# Patient Record
Sex: Male | Born: 1975 | Race: Black or African American | Hispanic: No | Marital: Single | Smoking: Never smoker
Health system: Southern US, Community
[De-identification: ages and names within clinical notes are randomized; demographics above are authoritative.]

## PROBLEM LIST (undated history)

## (undated) DIAGNOSIS — I10 Essential (primary) hypertension: Secondary | ICD-10-CM

## (undated) DIAGNOSIS — F32A Depression, unspecified: Secondary | ICD-10-CM

## (undated) DIAGNOSIS — R7303 Prediabetes: Secondary | ICD-10-CM

## (undated) DIAGNOSIS — R519 Headache, unspecified: Secondary | ICD-10-CM

## (undated) HISTORY — DX: Headache, unspecified: R51.9

## (undated) HISTORY — DX: Depression, unspecified: F32.A

---

## 2015-01-02 ENCOUNTER — Encounter: Payer: Self-pay | Admitting: Emergency Medicine

## 2015-01-02 ENCOUNTER — Emergency Department: Payer: 59

## 2015-01-02 ENCOUNTER — Emergency Department
Admission: EM | Admit: 2015-01-02 | Discharge: 2015-01-02 | Disposition: A | Discharge status: Home / Self Care | Payer: 59 | Attending: Emergency Medicine | Admitting: Emergency Medicine

## 2015-01-02 DIAGNOSIS — I10 Essential (primary) hypertension: Secondary | ICD-10-CM | POA: Insufficient documentation

## 2015-01-02 DIAGNOSIS — R079 Chest pain, unspecified: Secondary | ICD-10-CM | POA: Diagnosis not present

## 2015-01-02 DIAGNOSIS — Z87891 Personal history of nicotine dependence: Secondary | ICD-10-CM | POA: Diagnosis not present

## 2015-01-02 DIAGNOSIS — R55 Syncope and collapse: Secondary | ICD-10-CM | POA: Insufficient documentation

## 2015-01-02 DIAGNOSIS — Z87898 Personal history of other specified conditions: Secondary | ICD-10-CM

## 2015-01-02 HISTORY — DX: Prediabetes: R73.03

## 2015-01-02 HISTORY — DX: Essential (primary) hypertension: I10

## 2015-01-02 LAB — CBC
HEMATOCRIT: 45 % (ref 40.0–52.0)
Hemoglobin: 14.9 g/dL (ref 13.0–18.0)
MCH: 28.7 pg (ref 26.0–34.0)
MCHC: 33.2 g/dL (ref 32.0–36.0)
MCV: 86.6 fL (ref 80.0–100.0)
Platelets: 100 10*3/uL — ABNORMAL LOW (ref 150–440)
RBC: 5.2 MIL/uL (ref 4.40–5.90)
RDW: 13.8 % (ref 11.5–14.5)
WBC: 8.1 10*3/uL (ref 3.8–10.6)

## 2015-01-02 LAB — BASIC METABOLIC PANEL
ANION GAP: 10 (ref 5–15)
BUN: 10 mg/dL (ref 6–20)
CALCIUM: 9.2 mg/dL (ref 8.9–10.3)
CO2: 23 mmol/L (ref 22–32)
Chloride: 109 mmol/L (ref 101–111)
Creatinine, Ser: 1.24 mg/dL (ref 0.61–1.24)
GFR calc Af Amer: >60 mL/min (ref >60)
Glucose, Bld: 109 mg/dL — ABNORMAL HIGH (ref 65–99)
POTASSIUM: 4 mmol/L (ref 3.5–5.1)
SODIUM: 142 mmol/L (ref 135–145)

## 2015-01-02 LAB — FIBRIN DERIVATIVES D-DIMER (ARMC ONLY): FIBRIN DERIVATIVES D-DIMER (ARMC): 224 (ref 0–499)

## 2015-01-02 LAB — TROPONIN I

## 2015-01-02 MED ORDER — KETOROLAC TROMETHAMINE 30 MG/ML IJ SOLN
30.0000 mg | Freq: Once | INTRAMUSCULAR | Status: AC
  Administered 2015-01-02: 30 mg via INTRAVENOUS
  Filled 2015-01-02: qty 1

## 2015-01-02 NOTE — ED Provider Notes (Signed)
Time Seen: Approximately 1600  I have reviewed the triage notes  Chief Complaint: Chest Pain and Loss of Consciousness   History of Present Illness: Austin BonierRobert Mcconnell is a 39 y.o. male who presents with what he describes as a one-year history of intermittent sharp chest pain before he states occasionally he gets a pressure discomfort that lasts for less than a minute. He states his last cardiac [place] sounds to [place]. He has a history of hypertension and "" borderline "" diabetes. No family history of early cardiovascular disease. He states he was standing and having a verbal disagreement with his girlfriend when he had sudden onset of sharp chest pain and then had a brief syncopal episode without any associated seizure activity. He denies any chest pain at present. He denies any current nausea or vomiting but states when he woke up he was somewhat nauseated. He denies any arm job back or flank discomfort. He states that the chest discomfort that he said intermittently for a year.can come on randomly and denies any obvious exertional factor. Patient denies any recent pulmonary emboli risk factors.  Past Medical History  Diagnosis Date  . Hypertension   . Prediabetes     There are no active problems to display for this patient.   History reviewed. No pertinent past surgical history.  History reviewed. No pertinent past surgical history.  No current outpatient prescriptions on file.  Allergies:  Review of patient's allergies indicates no known allergies.  Family History: History reviewed. No pertinent family history.  Social History: Social History  Substance Use Topics  . Smoking status: Former Games developermoker  . Smokeless tobacco: None  . Alcohol Use: Yes     Comment: occasional beer     Review of Systems:   10 point review of systems was performed and was otherwise negative:  Constitutional: No fever Eyes: No visual disturbances ENT: No sore  throat, ear pain Cardiac: No chest pain Respiratory: No shortness of breath, wheezing, or stridor Abdomen: No abdominal pain, no vomiting, No diarrhea Endocrine: No weight loss, No night sweats Extremities: No peripheral edema, cyanosis Skin: No rashes, easy bruising Neurologic: Patient describes a bilateral temporal headache. He states he's had similar headaches like this before and normally takes a "" BC powder "" and it usually resolves. No focal weakness, trouble with speech or swollowing Urologic: No dysuria, Hematuria, or urinary frequency   Physical Exam:  ED Triage Vitals  Enc Vitals Group     BP 01/02/15 1534 129/92 mmHg     Pulse Rate 01/02/15 1534 102     Resp 01/02/15 1534 13     Temp 01/02/15 1534 98.8 F (37.1 C)     Temp Source 01/02/15 1534 Oral     SpO2 01/02/15 1534 95 %     Weight 01/02/15 1534 200 lb (90.719 kg)     Height 01/02/15 1534 5\' 5"  (1.651 m)     Head Cir --      Peak Flow --      Pain Score --      Pain Loc --      Pain Edu? --      Excl. in GC? --     General: Awake , Alert , and Oriented times 3; GCS 15 Head: Normal cephalic , atraumatic Eyes: Pupils equal , round, reactive to light Nose/Throat: No nasal drainage, patent upper airway without erythema or exudate.  Neck: Supple, Full range of motion, No anterior adenopathy  or palpable thyroid masses Lungs: Clear to ascultation without wheezes , rhonchi, or rales Heart: Regular rate, regular rhythm without murmurs , gallops , or rubs Abdomen: Soft, non tender without rebound, guarding , or rigidity; bowel sounds positive and symmetric in all 4 quadrants. No organomegaly .        Extremities: 2 plus symmetric pulses. No edema, clubbing or cyanosis Neurologic: normal ambulation, Motor symmetric without deficits, sensory intact Skin: warm, dry, no rashes   Labs:   All laboratory work was reviewed including any pertinent negatives or positives listed below:  Labs Reviewed  BASIC METABOLIC  PANEL - Abnormal; Notable for the following:    Glucose, Bld 109 (*)    All other components within normal limits  CBC - Abnormal; Notable for the following:    Platelets 100 (*)    All other components within normal limits  TROPONIN I  FIBRIN DERIVATIVES D-DIMER ([facility] ONLY)   laboratory work was reviewed with no significant abnormalities  EKG:  ED ECG REPORT I, Jennye Moccasin, the attending physician, personally viewed and interpreted this ECG.  Date: 01/02/2015 EKG Time: 1530 Rate: 101 Rhythm: normal sinus rhythm QRS Axis: normal Intervals: normal ST/T Wave abnormalities: normal Conduction Disutrbances: none Narrative Interpretation: unremarkable Findings of early repolarization   Radiology:    EXAM: CHEST 2 VIEW  COMPARISON: None.  FINDINGS: The heart size and mediastinal contours are within normal limits. Both lungs are clear. The visualized skeletal structures are unremarkable.  IMPRESSION: No active cardiopulmonary disease.   I personally reviewed the radiologic studies    ED Course: * Patient's stay here was uneventful and given that these episodes of transient chest discomfort have been occurring now for the past year I felt we could evaluate the patient on an outpatient basis. The syncopal episode today was a new finding and I don't suspect any life-threatening arrhythmias at this time. He did not injure himself with his fall and it seems it was more of a gradual loss of consciousness which would make it unlikely to be cardiogenic in nature. Patient does not appear to have any cardiomegaly or any abnormalities on his EKG that would indicate IHSS. I discussed the patient at length the necessity and follow-up with cardiology for possible echocardiogram etc. Patient's case was reviewed with the unassigned cardiologist who agrees with outpatient management and the patient's been advised to call their office in the morning.   Assessment: Nonspecific chest  pain Syncope     Plan: * Outpatient management Patient was advised to return immediately if condition worsens. Patient was advised to follow up with their primary care physician or other specialized physicians involved in their outpatient care             Jennye Moccasin, MD 01/02/15 [phone]

## 2015-01-02 NOTE — ED Notes (Signed)
Pt to ED from home via EMS c/o chest pain and syncope.  Per EMS patient arguing with girlfriend and had sudden sharp pain to left chest and then patient had syncopal episode.  Pt remembers waking up on ground lying on back.  Pt denies hitting head.  Pt has hx of similar chest pain with today being worse, denies strenuous activity.  Pt hx of HTN, prediabetes, denies family hx.  Pt states feeling lightheaded, dizzy, and flushed.  Presents A&Ox4, speaking in complete and coherent sentences and in NAD at this time.

## 2015-01-02 NOTE — ED Notes (Signed)
Patient transported to X-ray 

## 2015-09-22 DIAGNOSIS — G4733 Obstructive sleep apnea (adult) (pediatric): Secondary | ICD-10-CM | POA: Insufficient documentation

## 2015-10-28 ENCOUNTER — Ambulatory Visit
Admission: RE | Admit: 2015-10-28 | Discharge: 2015-10-28 | Disposition: A | Discharge status: Home / Self Care | Payer: Commercial Managed Care - HMO | Source: Ambulatory Visit | Attending: Internal Medicine | Admitting: Internal Medicine

## 2015-10-28 ENCOUNTER — Encounter
Admission: RE | Disposition: A | Discharge status: Home / Self Care | Payer: Self-pay | Source: Ambulatory Visit | Attending: Internal Medicine

## 2015-10-28 ENCOUNTER — Encounter: Payer: Self-pay | Admitting: Internal Medicine

## 2015-10-28 DIAGNOSIS — G4733 Obstructive sleep apnea (adult) (pediatric): Secondary | ICD-10-CM | POA: Insufficient documentation

## 2015-10-28 DIAGNOSIS — R0789 Other chest pain: Secondary | ICD-10-CM | POA: Diagnosis not present

## 2015-10-28 DIAGNOSIS — Z79899 Other long term (current) drug therapy: Secondary | ICD-10-CM | POA: Diagnosis not present

## 2015-10-28 DIAGNOSIS — I10 Essential (primary) hypertension: Secondary | ICD-10-CM | POA: Insufficient documentation

## 2015-10-28 DIAGNOSIS — Z87891 Personal history of nicotine dependence: Secondary | ICD-10-CM | POA: Insufficient documentation

## 2015-10-28 DIAGNOSIS — Z6833 Body mass index (BMI) 33.0-33.9, adult: Secondary | ICD-10-CM | POA: Insufficient documentation

## 2015-10-28 DIAGNOSIS — E669 Obesity, unspecified: Secondary | ICD-10-CM | POA: Diagnosis not present

## 2015-10-28 DIAGNOSIS — E119 Type 2 diabetes mellitus without complications: Secondary | ICD-10-CM | POA: Insufficient documentation

## 2015-10-28 DIAGNOSIS — R079 Chest pain, unspecified: Secondary | ICD-10-CM | POA: Diagnosis present

## 2015-10-28 HISTORY — PX: CARDIAC CATHETERIZATION: SHX172

## 2015-10-28 LAB — CARDIAC CATHETERIZATION: CATHEFQUANT: 60 %

## 2015-10-28 SURGERY — LEFT HEART CATH AND CORONARY ANGIOGRAPHY
Anesthesia: Moderate Sedation | Laterality: Left

## 2015-10-28 SURGERY — LEFT HEART CATH AND CORONARY ANGIOGRAPHY
Anesthesia: Moderate Sedation

## 2015-10-28 MED ORDER — ACETAMINOPHEN 325 MG PO TABS
650.0000 mg | ORAL_TABLET | ORAL | Status: DC | PRN
  Administered 2015-10-28: 650 mg via ORAL

## 2015-10-28 MED ORDER — SODIUM CHLORIDE 0.9 % IV SOLN: 250.0000 mL | INTRAVENOUS | Status: DC | PRN

## 2015-10-28 MED ORDER — ACETAMINOPHEN 325 MG PO TABS
ORAL_TABLET | ORAL | Status: AC
  Administered 2015-10-28: 650 mg via ORAL
  Filled 2015-10-28: qty 2

## 2015-10-28 MED ORDER — SODIUM CHLORIDE 0.9% FLUSH: 3.0000 mL | Freq: Two times a day (BID) | INTRAVENOUS | Status: DC

## 2015-10-28 MED ORDER — SODIUM CHLORIDE 0.9 % IV SOLN: INTRAVENOUS | Status: DC

## 2015-10-28 MED ORDER — FENTANYL CITRATE (PF) 100 MCG/2ML IJ SOLN
INTRAMUSCULAR | Status: AC
  Filled 2015-10-28: qty 2

## 2015-10-28 MED ORDER — MIDAZOLAM HCL 2 MG/2ML IJ SOLN
INTRAMUSCULAR | Status: AC
  Filled 2015-10-28: qty 2

## 2015-10-28 MED ORDER — FENTANYL CITRATE (PF) 100 MCG/2ML IJ SOLN
INTRAMUSCULAR | Status: DC | PRN
  Administered 2015-10-28: 25 ug via INTRAVENOUS

## 2015-10-28 MED ORDER — ASPIRIN 81 MG PO CHEW: 81.0000 mg | CHEWABLE_TABLET | ORAL | Status: DC

## 2015-10-28 MED ORDER — SODIUM CHLORIDE 0.9% FLUSH: 3.0000 mL | INTRAVENOUS | Status: DC | PRN

## 2015-10-28 MED ORDER — MIDAZOLAM HCL 2 MG/2ML IJ SOLN
INTRAMUSCULAR | Status: DC | PRN
  Administered 2015-10-28: 1 mg via INTRAVENOUS

## 2015-10-28 MED ORDER — HEPARIN (PORCINE) IN NACL 2-0.9 UNIT/ML-% IJ SOLN
INTRAMUSCULAR | Status: AC
  Filled 2015-10-28: qty 1000

## 2015-10-28 MED ORDER — ONDANSETRON HCL 4 MG/2ML IJ SOLN: 4.0000 mg | Freq: Four times a day (QID) | INTRAMUSCULAR | Status: DC | PRN

## 2015-10-28 MED ORDER — SODIUM CHLORIDE 0.9 % WEIGHT BASED INFUSION: 3.0000 mL/kg/h | INTRAVENOUS | Status: DC

## 2015-10-28 MED ORDER — IOPAMIDOL (ISOVUE-300) INJECTION 61%
INTRAVENOUS | Status: DC | PRN
  Administered 2015-10-28: 100 mL via INTRA_ARTERIAL

## 2015-10-28 SURGICAL SUPPLY — 9 items
CATH 5FR JL4 DIAGNOSTIC (CATHETERS) ×2 IMPLANT
CATH 5FR JR4 DIAGNOSTIC (CATHETERS) ×2 IMPLANT
CATH 5FR PIGTAIL DIAGNOSTIC (CATHETERS) ×2 IMPLANT
DEVICE CLOSURE MYNXGRIP 5F (Vascular Products) ×2 IMPLANT
KIT MANI 3VAL PERCEP (MISCELLANEOUS) ×2 IMPLANT
NEEDLE PERC 18GX7CM (NEEDLE) ×2 IMPLANT
PACK CARDIAC CATH (CUSTOM PROCEDURE TRAY) ×2 IMPLANT
SHEATH AVANTI 5FR X 11CM (SHEATH) ×2 IMPLANT
WIRE EMERALD 3MM-J .035X150CM (WIRE) ×2 IMPLANT

## 2015-10-28 NOTE — Discharge Instructions (Signed)

## 2017-05-11 IMAGING — CR DG CHEST 2V
1 series · 2 of 2 positions shown · non-contrast
Comparison: None.

CLINICAL DATA: Anterior chest pain, syncope

EXAM:
CHEST  2 VIEW

[Series 1: dg chest 2 view · 0.14mm/px · 2 of 2 slices shown]
[im 1/2]
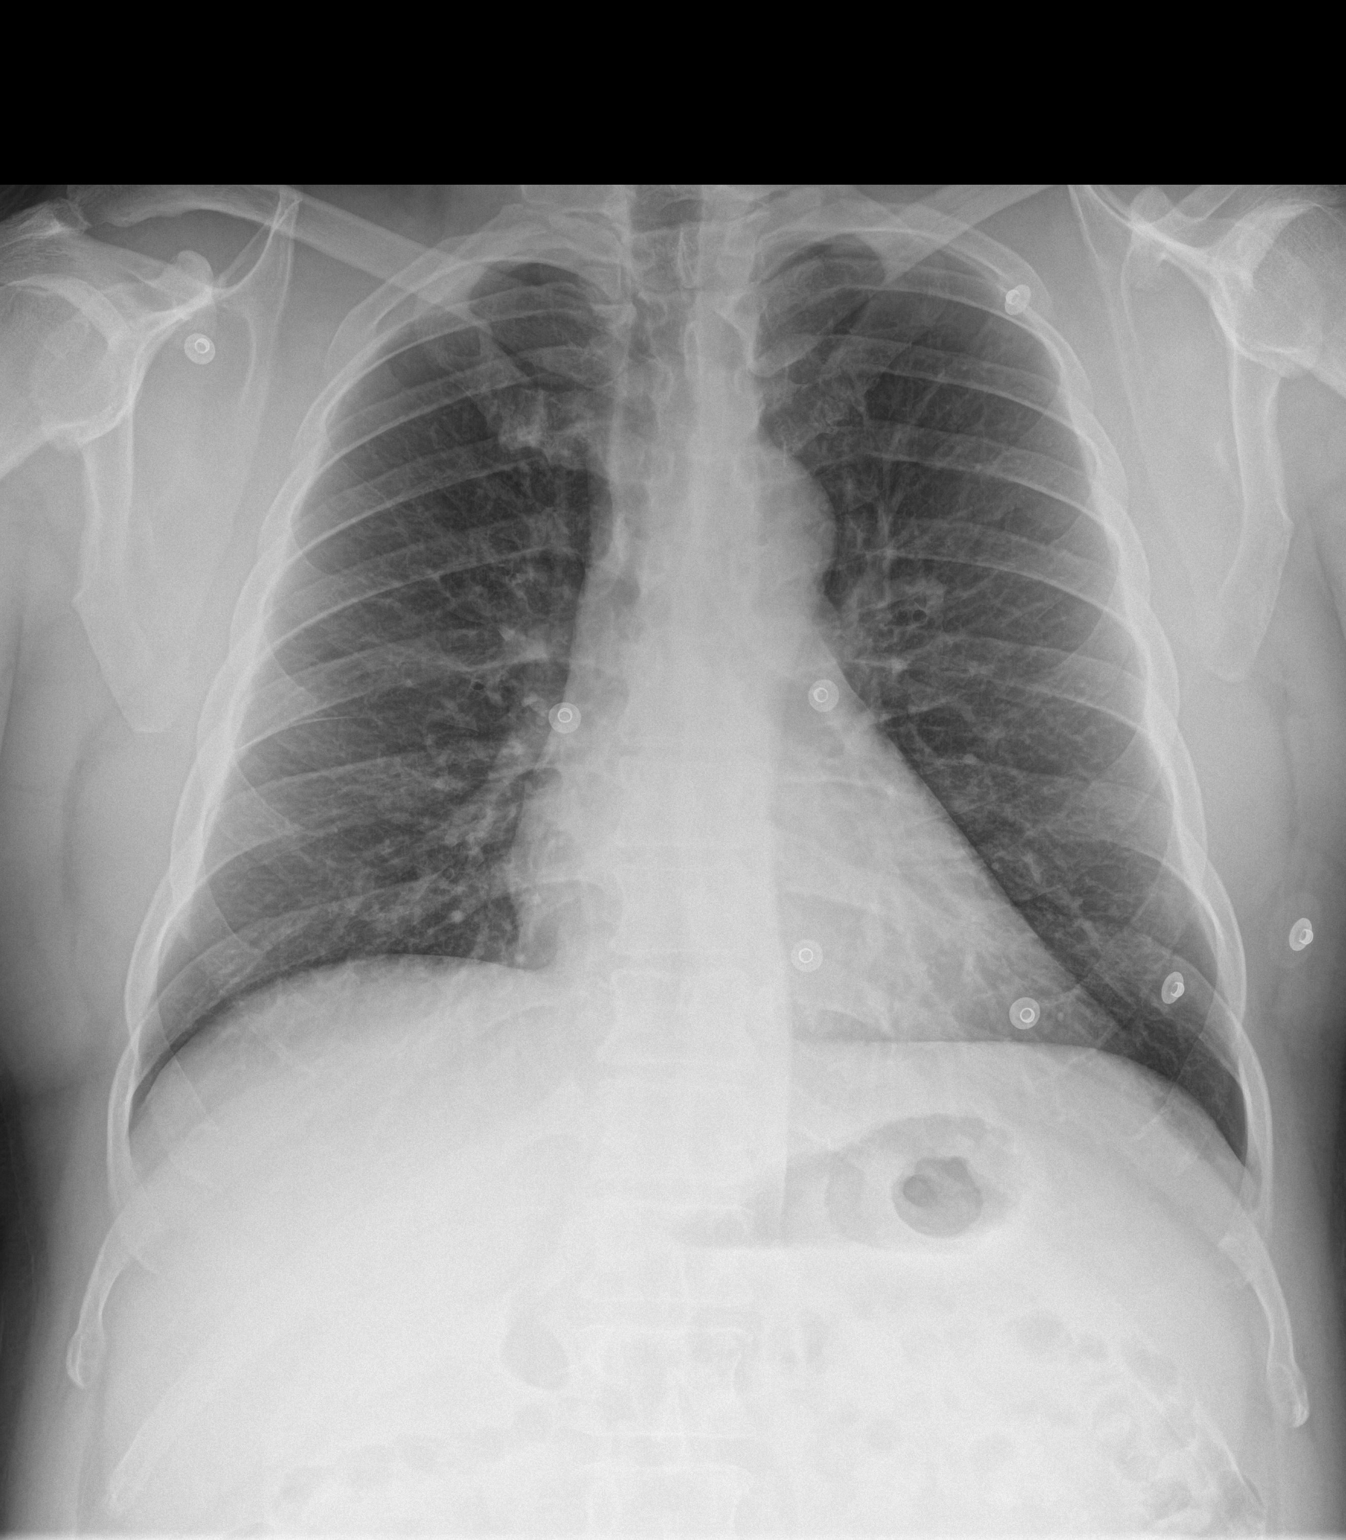
[im 2/2]
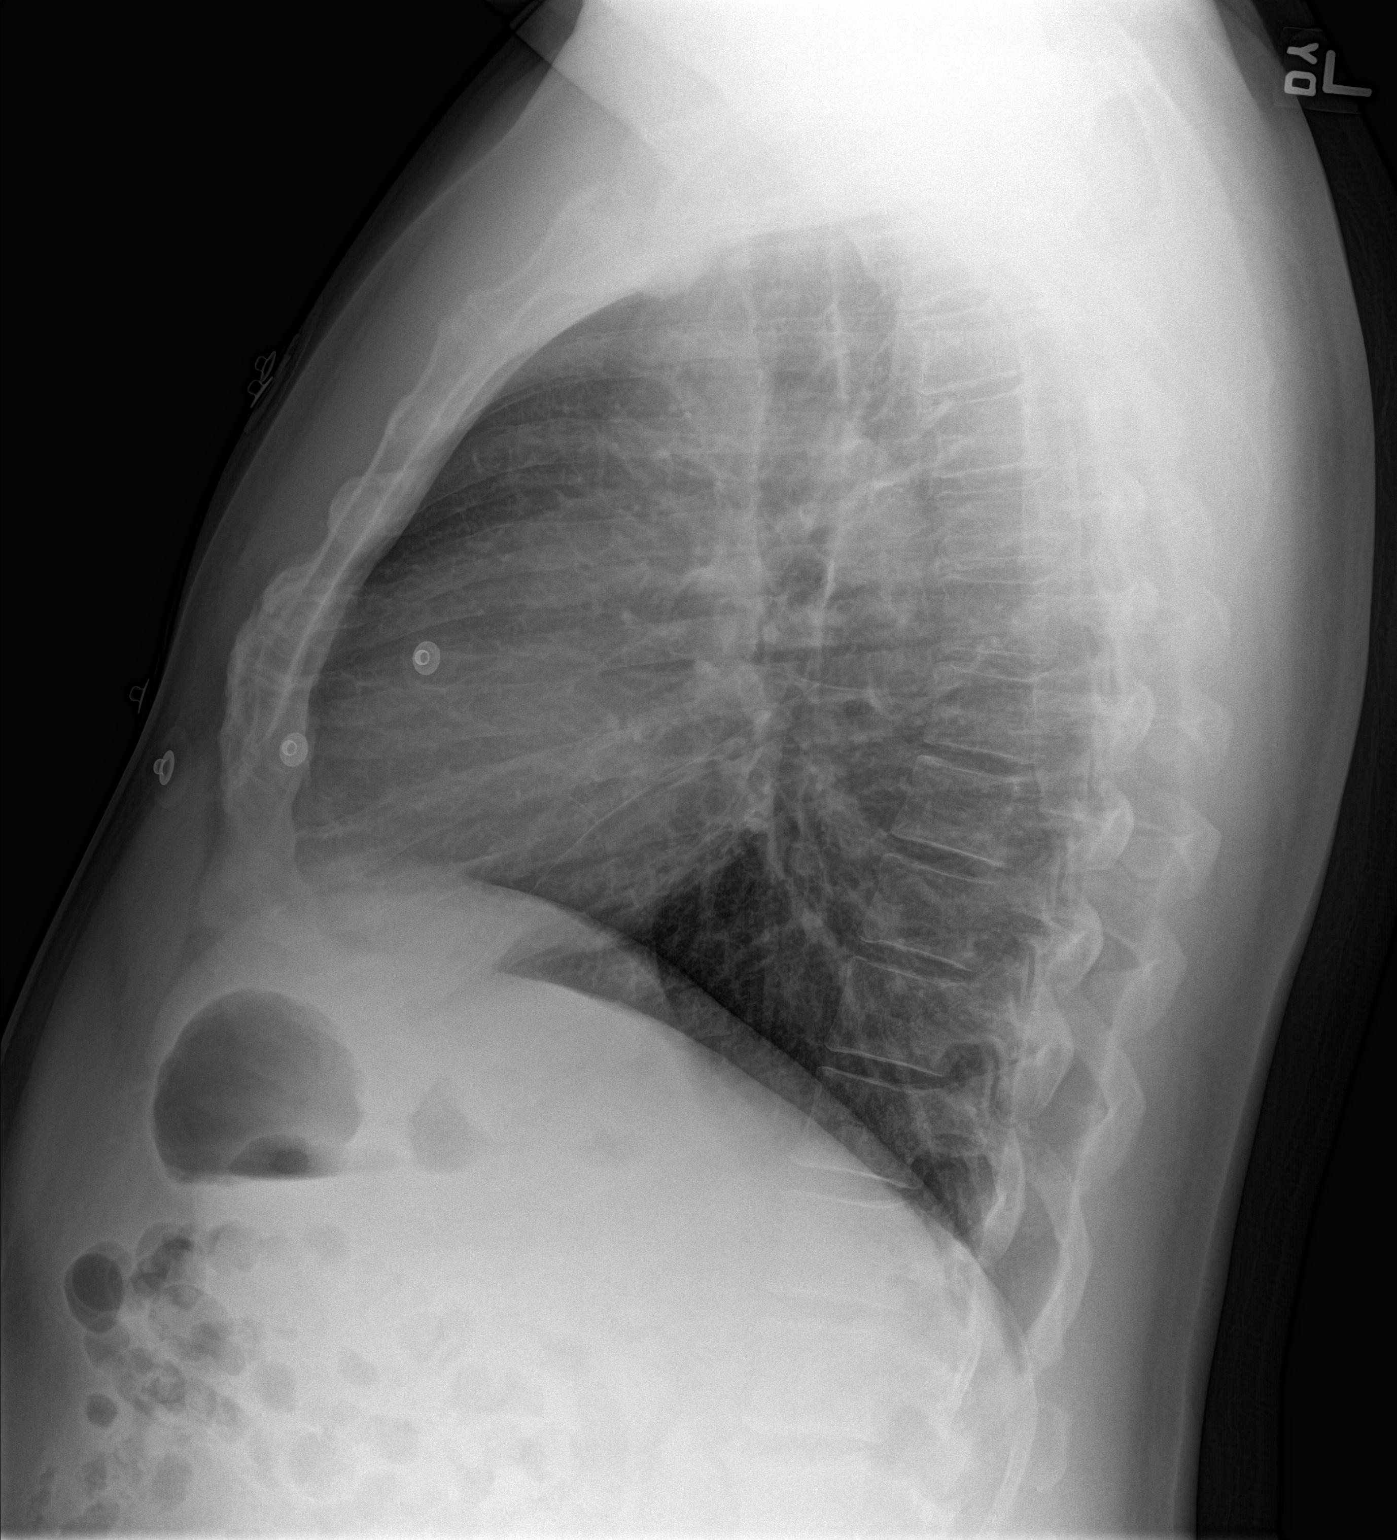

[2 of 2 positions shown; findings below may reference images not displayed]

FINDINGS: The heart size and mediastinal contours are within normal limits.
Both lungs are clear. The visualized skeletal structures are
unremarkable.
IMPRESSION: No active cardiopulmonary disease.

## 2020-08-19 ENCOUNTER — Other Ambulatory Visit: Payer: Self-pay

## 2020-08-19 ENCOUNTER — Encounter: Payer: Self-pay | Admitting: Physician Assistant

## 2020-08-19 ENCOUNTER — Ambulatory Visit: Payer: Self-pay | Admitting: Physician Assistant

## 2020-08-19 DIAGNOSIS — Z113 Encounter for screening for infections with a predominantly sexual mode of transmission: Secondary | ICD-10-CM

## 2020-08-19 DIAGNOSIS — Z202 Contact with and (suspected) exposure to infections with a predominantly sexual mode of transmission: Secondary | ICD-10-CM

## 2020-08-19 LAB — GRAM STAIN

## 2020-08-19 MED ORDER — METRONIDAZOLE 500 MG PO TABS: 500.0000 mg | ORAL_TABLET | Freq: Two times a day (BID) | ORAL | 0 refills | Status: AC

## 2020-08-19 NOTE — Progress Notes (Signed)
[facility] Department STI clinic/screening visit  Subjective:  Austin Mcconnell is a 45 y.o. male being seen today for an STI screening visit. The patient reports they do have symptoms.    Patient has the following medical conditions:  There are no problems to display for this patient.    Chief Complaint  Patient presents with   SEXUALLY TRANSMITTED DISEASE    screening    HPI  Patient reports that he is a contact to Trich and has had a "pinching/feels like a needle is inside my urethra" off and on for 1 week.  Denies chronic conditions and regular medicines.  States it has been several years since his last HIV test and last void prior to sample collection for Gram stain was about 45 min ago.   See flowsheet for further details and programmatic requirements.    The following portions of the patient's history were reviewed and updated as appropriate: allergies, current medications, past medical history, past social history, past surgical history and problem list.  Objective:  There were no vitals filed for this visit.  Physical Exam Constitutional:      General: He is not in acute distress.    Appearance: Normal appearance.  HENT:     Head: Normocephalic and atraumatic.     Comments: No nits,lice, or hair loss. No cervical, supraclavicular or axillary adenopathy.     Mouth/Throat:     Mouth: Mucous membranes are moist.     Pharynx: Oropharynx is clear. No oropharyngeal exudate or posterior oropharyngeal erythema.  Eyes:     Conjunctiva/sclera: Conjunctivae normal.  Pulmonary:     Effort: Pulmonary effort is normal.  Abdominal:     Palpations: Abdomen is soft. There is no mass.     Tenderness: There is no abdominal tenderness. There is no guarding or rebound.  Genitourinary:    Penis: Normal.      Testes: Normal.     Comments: Pubic area without nits, lice, hair loss, edema, erythema, lesions and inguinal adenopathy. Penis circumcised without rash, lesions  and discharge at meatus. Testicles descended bilaterally,nt, no masses or edema.  Musculoskeletal:     Cervical back: Neck supple. No tenderness.  Skin:    General: Skin is warm and dry.     Findings: No bruising, erythema, lesion or rash.  Neurological:     Mental Status: He is alert and oriented to person, place, and time.  Psychiatric:        Mood and Affect: Mood normal.        Behavior: Behavior normal.        Thought Content: Thought content normal.        Judgment: Judgment normal.      Assessment and Plan:  Austin Mcconnell is a 45 y.o. male presenting to the [facility] Department for STI screening  1. Screening for STD (sexually transmitted disease) Patient into clinic without symptoms. Rec condoms with all sex. Await test results.  Counseled that RN will call if needs to RTC for treatment once results are back.  - Gram stain - Gonococcus culture - HIV [facility] - Syphilis Serology, [facility] - Gonococcus culture  2. Trichomonas contact Treat as a contact to Trich with Metronidazole 500 mg #14 1 po BID for 7 days with food, no EtOH for 24 hr before and until 72 hr after completing medicine. No sex for 14 days and until after partner completes treatment. Call with questions or concerns. - metroNIDAZOLE (  FLAGYL) 500 MG tablet; Take 1 tablet (500 mg total) by mouth 2 (two) times daily for 7 days.  Dispense: 14 tablet; Refill: 0     No follow-ups on file.  No future appointments.  Matt Holmes, PA

## 2020-08-19 NOTE — Progress Notes (Signed)
Lab results reviewed with provider. Providers orders completed.

## 2020-08-23 LAB — GONOCOCCUS CULTURE

## 2020-10-13 ENCOUNTER — Encounter: Payer: Self-pay | Admitting: Emergency Medicine

## 2020-10-13 ENCOUNTER — Other Ambulatory Visit: Payer: Self-pay

## 2020-10-13 ENCOUNTER — Emergency Department
Admission: EM | Admit: 2020-10-13 | Discharge: 2020-10-13 | Disposition: A | Discharge status: Home / Self Care | Payer: Self-pay | Attending: Emergency Medicine | Admitting: Emergency Medicine

## 2020-10-13 DIAGNOSIS — F17210 Nicotine dependence, cigarettes, uncomplicated: Secondary | ICD-10-CM | POA: Insufficient documentation

## 2020-10-13 DIAGNOSIS — R300 Dysuria: Secondary | ICD-10-CM | POA: Insufficient documentation

## 2020-10-13 DIAGNOSIS — Z79899 Other long term (current) drug therapy: Secondary | ICD-10-CM | POA: Insufficient documentation

## 2020-10-13 DIAGNOSIS — I10 Essential (primary) hypertension: Secondary | ICD-10-CM | POA: Insufficient documentation

## 2020-10-13 DIAGNOSIS — R369 Urethral discharge, unspecified: Secondary | ICD-10-CM | POA: Insufficient documentation

## 2020-10-13 LAB — URINALYSIS, COMPLETE (UACMP) WITH MICROSCOPIC
Bacteria, UA: NONE SEEN
Bilirubin Urine: NEGATIVE
Glucose, UA: NEGATIVE mg/dL
Hgb urine dipstick: NEGATIVE
Ketones, ur: NEGATIVE mg/dL
Leukocytes,Ua: NEGATIVE
Nitrite: NEGATIVE
Protein, ur: NEGATIVE mg/dL
Specific Gravity, Urine: 1.024 (ref 1.005–1.030)
Squamous Epithelial / LPF: NONE SEEN (ref 0–5)
pH: 5 (ref 5.0–8.0)

## 2020-10-13 LAB — CHLAMYDIA/NGC RT PCR (ARMC ONLY)
Chlamydia Tr: NOT DETECTED
N gonorrhoeae: NOT DETECTED

## 2020-10-13 MED ORDER — DOXYCYCLINE MONOHYDRATE 100 MG PO TABS: 100.0000 mg | ORAL_TABLET | Freq: Two times a day (BID) | ORAL | 0 refills | Status: AC

## 2020-10-13 MED ORDER — CEFTRIAXONE SODIUM 1 G IJ SOLR
500.0000 mg | Freq: Once | INTRAMUSCULAR | Status: AC
  Administered 2020-10-13: 500 mg via INTRAMUSCULAR
  Filled 2020-10-13: qty 10

## 2020-10-13 NOTE — ED Provider Notes (Signed)
[facility]-EMERGENCY DEPARTMENT  ____________________________________________  Time seen: Approximately 8:36 PM  I have reviewed the triage vital signs and the nursing notes.   HISTORY  Chief Complaint Dysuria   Historian Patient     HPI Austin Mcconnell is a 45 y.o. male BPH and urethral stricture presents to the emergency department with dysuria and purulent penile discharge for the past 2 to 3 days.  Patient has recently had unprotected sex with a new partner and reports history of chlamydia.  Patient states that he has had prostatitis in the past but usually has a sensation of rectal fullness and rectal pain which he does not currently have.  He denies flank pain or nausea and vomiting.  No fever at home.  He states that he has frequent urethritis which sometimes stems from different soaps or lotions used at home.   Past Medical History:  Diagnosis Date   Hypertension    Prediabetes      Immunizations up to date:  Yes.     Past Medical History:  Diagnosis Date   Hypertension    Prediabetes     There are no problems to display for this patient.   Past Surgical History:  Procedure Laterality Date   CARDIAC CATHETERIZATION Left 10/28/2015   Procedure: Left Heart Cath and Coronary Angiography;  Surgeon: Alwyn Pea, MD;  Location: [facility] INVASIVE CV LAB;  Service: Cardiovascular;  Laterality: Left;    Prior to Admission medications   Medication Sig Start Date End Date Taking? Authorizing Provider  doxycycline (ADOXA) 100 MG tablet Take 1 tablet (100 mg total) by mouth 2 (two) times daily for 14 days. 10/13/20 10/27/20 Yes Pia Mau M, PA-C  amLODipine (NORVASC) 5 MG tablet Take 5 mg by mouth daily. Patient not taking: Reported on 08/19/2020    [provider]  isosorbide mononitrate (IMDUR) 30 MG 24 hr tablet Take 30 mg by mouth daily. Patient not taking: Reported on 08/19/2020    [provider]  metoprolol succinate (TOPROL-XL) 25 MG 24 hr tablet  Take 12.5 mg by mouth daily. Patient not taking: Reported on 08/19/2020    [provider]    Allergies Patient has no known allergies.  History reviewed. No pertinent family history.  Social History Social History   Tobacco Use   Smoking status: Every Day    Types: Cigarettes   Smokeless tobacco: Never  Substance Use Topics   Alcohol use: Yes    Comment: occasional beer   Drug use: No     Review of Systems  Constitutional: No fever/chills Eyes:  No discharge ENT: No upper respiratory complaints. Respiratory: no cough. No SOB/ use of accessory muscles to breath Gastrointestinal:   No nausea, no vomiting.  No diarrhea.  No constipation. Genitourinary: Patient has dysuria and penile discharge.  Musculoskeletal: Negative for musculoskeletal pain. Skin: Negative for rash, abrasions, lacerations, ecchymosis.    ____________________________________________   PHYSICAL EXAM:  VITAL SIGNS: ED Triage Vitals  Enc Vitals Group     BP 10/13/20 1822 (!) 146/113     Pulse Rate 10/13/20 1822 84     Resp 10/13/20 1822 20     Temp 10/13/20 1822 98.6 F (37 C)     Temp Source 10/13/20 1822 Oral     SpO2 10/13/20 1822 98 %     Weight 10/13/20 1823 210 lb (95.3 kg)     Height 10/13/20 1823 5' 5.5" (1.664 m)     Head Circumference --      Peak  Flow --      Pain Score 10/13/20 1823 5     Pain Loc --      Pain Edu? --      Excl. in GC? --      Constitutional: Alert and oriented. Well appearing and in no acute distress. Eyes: Conjunctivae are normal. PERRL. EOMI. Head: Atraumatic. ENT:      Nose: No congestion/rhinnorhea.      Mouth/Throat: Mucous membranes are moist.  Neck: No stridor.  No cervical spine tenderness to palpation. Cardiovascular: Normal rate, regular rhythm. Normal S1 and S2.  Good peripheral circulation. Respiratory: Normal respiratory effort without tachypnea or retractions. Lungs CTAB. Good air entry to the bases with no decreased or absent  breath sounds Gastrointestinal: Bowel sounds x 4 quadrants. Soft and nontender to palpation. No guarding or rigidity. No distention. Musculoskeletal: Full range of motion to all extremities. No obvious deformities noted Neurologic:  Normal for age. No gross focal neurologic deficits are appreciated.  Skin:  Skin is warm, dry and intact. No rash noted. Psychiatric: Mood and affect are normal for age. Speech and behavior are normal.   ____________________________________________   LABS (all labs ordered are listed, but only abnormal results are displayed)  Labs Reviewed  URINALYSIS, COMPLETE (UACMP) WITH MICROSCOPIC - Abnormal; Notable for the following components:      Result Value   Color, Urine YELLOW (*)    APPearance CLEAR (*)    All other components within normal limits  CHLAMYDIA/NGC RT PCR ([facility] ONLY)             ____________________________________________  EKG   ____________________________________________  RADIOLOGY  No results found.  ____________________________________________    PROCEDURES  Procedure(s) performed:     Procedures     Medications  cefTRIAXone (ROCEPHIN) injection 500 mg (has no administration in time range)     ____________________________________________   INITIAL IMPRESSION / ASSESSMENT AND PLAN / ED COURSE  Pertinent labs & imaging results that were available during my care of the patient were reviewed by me and considered in my medical decision making (see chart for details).      Assessment and plan Urethritis 45 year old male presents to the emergency department with dysuria improved penile discharge with recent unprotected sex with a new partner.  Patient was hypertensive at triage but vital signs were otherwise reassuring.  On exam, patient was alert, active and nontoxic-appearing.  Urinalysis showed no signs of UTI and gonorrhea and chlamydia from urinalysis was negative.  We will still treat patient empirically  given history of unprotected sex with Rocephin and will discharge patient with doxycycline.  Extended patient's doxycycline course to 14 days given history of prostatitis.  Return precautions were given to return with new or worsening symptoms.  Advised patient to follow-up with his urologist    ____________________________________________  FINAL CLINICAL IMPRESSION(S) / ED DIAGNOSES  Final diagnoses:  Dysuria      NEW MEDICATIONS STARTED DURING THIS VISIT:  ED Discharge Orders          Ordered    doxycycline (ADOXA) 100 MG tablet  2 times daily        10/13/20 2029                This chart was dictated using voice recognition software/Dragon. Despite best efforts to proofread, errors can occur which can change the meaning. Any change was purely unintentional.     Gasper Lloyd 10/13/20 2042    Delton Prairie, MD 10/13/20 [phone]

## 2020-10-13 NOTE — Discharge Instructions (Signed)
Take Doxycycline twice daily for the next 14 days.

## 2020-10-13 NOTE — ED Triage Notes (Signed)
Pt to ED via POV with c/o intermittent penile discharge that is murky and states feels like something is "crawling inside my urethra". Pt states hx of urethritis and chlamydia. Pt states could be either, states recent change in soaps which has caused urethritis and recent change in sexual partner.

## 2020-10-13 NOTE — ED Provider Notes (Signed)
Emergency Medicine Provider Triage Evaluation Note  Austin Mcconnell , a 44 y.o. male  was evaluated in triage.  Pt complains of dysuria.  And burning/chronic sensation in his urethra.  He has a history of urethritis and chlamydia.  Recent sexual partner with unprotected sex    Review of Systems  Positive: Dysuria, "crawling sensation in urethra Negative: No hematuria.  No abdominal pain.  No flank pain.  Physical Exam  BP (!) 146/113 (BP Location: Left Arm)   Pulse 84   Temp 98.6 F (37 C) (Oral)   Resp 20   Ht 5' 5.5" (1.664 m)   Wt 95.3 kg   SpO2 98%   BMI 34.41 kg/m  Gen:   Awake, no distress   Resp:  Normal effort  MSK:   Moves extremities without difficulty  Other:    Medical Decision Making  Medically screening exam initiated at 6:33 PM.  Appropriate orders placed.  Austin Mcconnell was informed that the remainder of the evaluation will be completed by another provider, this initial triage assessment does not replace that evaluation, and the importance of remaining in the ED until their evaluation is complete.  Patient will have labs for urinalysis and gonorrhea chlamydia.  Will likely need empiric treatment.   Lanette Hampshire 10/13/20 Renetta Chalk, MD 10/14/20 [id number]

## 2020-11-10 ENCOUNTER — Other Ambulatory Visit: Payer: Self-pay

## 2020-11-10 ENCOUNTER — Observation Stay
Admission: EM | Admit: 2020-11-10 | Discharge: 2020-11-11 | Disposition: A | Discharge status: Home / Self Care | Payer: Self-pay | Attending: Internal Medicine | Admitting: Internal Medicine

## 2020-11-10 ENCOUNTER — Other Ambulatory Visit: Payer: Self-pay | Admitting: Radiology

## 2020-11-10 ENCOUNTER — Encounter: Payer: Self-pay | Admitting: Internal Medicine

## 2020-11-10 ENCOUNTER — Emergency Department: Payer: Self-pay

## 2020-11-10 DIAGNOSIS — R079 Chest pain, unspecified: Secondary | ICD-10-CM

## 2020-11-10 DIAGNOSIS — Z79899 Other long term (current) drug therapy: Secondary | ICD-10-CM | POA: Insufficient documentation

## 2020-11-10 DIAGNOSIS — Z716 Tobacco abuse counseling: Secondary | ICD-10-CM

## 2020-11-10 DIAGNOSIS — R071 Chest pain on breathing: Secondary | ICD-10-CM

## 2020-11-10 DIAGNOSIS — R0789 Other chest pain: Principal | ICD-10-CM | POA: Insufficient documentation

## 2020-11-10 DIAGNOSIS — I10 Essential (primary) hypertension: Secondary | ICD-10-CM

## 2020-11-10 DIAGNOSIS — R7303 Prediabetes: Secondary | ICD-10-CM | POA: Insufficient documentation

## 2020-11-10 DIAGNOSIS — E1159 Type 2 diabetes mellitus with other circulatory complications: Secondary | ICD-10-CM

## 2020-11-10 DIAGNOSIS — Z20822 Contact with and (suspected) exposure to covid-19: Secondary | ICD-10-CM | POA: Insufficient documentation

## 2020-11-10 DIAGNOSIS — Z720 Tobacco use: Secondary | ICD-10-CM

## 2020-11-10 DIAGNOSIS — F17210 Nicotine dependence, cigarettes, uncomplicated: Secondary | ICD-10-CM | POA: Insufficient documentation

## 2020-11-10 LAB — CBC
HCT: 45.1 % (ref 39.0–52.0)
Hemoglobin: 15 g/dL (ref 13.0–17.0)
MCH: 29.5 pg (ref 26.0–34.0)
MCHC: 33.3 g/dL (ref 30.0–36.0)
MCV: 88.8 fL (ref 80.0–100.0)
Platelets: 208 10*3/uL (ref 150–400)
RBC: 5.08 MIL/uL (ref 4.22–5.81)
RDW: 13.4 % (ref 11.5–15.5)
WBC: 6.8 10*3/uL (ref 4.0–10.5)
nRBC: 0 % (ref 0.0–0.2)

## 2020-11-10 LAB — RESP PANEL BY RT-PCR (FLU A&B, COVID) ARPGX2
Influenza A by PCR: NEGATIVE
Influenza B by PCR: NEGATIVE
SARS Coronavirus 2 by RT PCR: NEGATIVE

## 2020-11-10 LAB — BASIC METABOLIC PANEL
Anion gap: 9 (ref 5–15)
BUN: 13 mg/dL (ref 6–20)
CO2: 26 mmol/L (ref 22–32)
Calcium: 9.1 mg/dL (ref 8.9–10.3)
Chloride: 103 mmol/L (ref 98–111)
Creatinine, Ser: 0.99 mg/dL (ref 0.61–1.24)
GFR, Estimated: >60 mL/min (ref >60)
Glucose, Bld: 125 mg/dL — ABNORMAL HIGH (ref 70–99)
Potassium: 4 mmol/L (ref 3.5–5.1)
Sodium: 138 mmol/L (ref 135–145)

## 2020-11-10 LAB — TROPONIN I (HIGH SENSITIVITY)
Troponin I (High Sensitivity): 6 ng/L (ref <18)
Troponin I (High Sensitivity): 4 ng/L (ref <18)

## 2020-11-10 LAB — HIV ANTIBODY (ROUTINE TESTING W REFLEX): HIV Screen 4th Generation wRfx: NONREACTIVE

## 2020-11-10 MED ORDER — HEPARIN SODIUM (PORCINE) 5000 UNIT/ML IJ SOLN
5000.0000 [IU] | Freq: Three times a day (TID) | INTRAMUSCULAR | Status: DC
  Administered 2020-11-10 – 2020-11-11 (×4): 5000 [IU] via SUBCUTANEOUS
  Filled 2020-11-10 (×4): qty 1

## 2020-11-10 MED ORDER — ALPRAZOLAM 0.5 MG PO TABS: 0.2500 mg | ORAL_TABLET | Freq: Two times a day (BID) | ORAL | Status: DC | PRN

## 2020-11-10 MED ORDER — METOPROLOL SUCCINATE ER 25 MG PO TB24
12.5000 mg | ORAL_TABLET | Freq: Every day | ORAL | Status: DC
  Administered 2020-11-10 – 2020-11-11 (×2): 12.5 mg via ORAL
  Filled 2020-11-10 (×3): qty 0.5

## 2020-11-10 MED ORDER — ACETAMINOPHEN 325 MG PO TABS: 650.0000 mg | ORAL_TABLET | ORAL | Status: DC | PRN

## 2020-11-10 MED ORDER — AMLODIPINE BESYLATE 5 MG PO TABS
5.0000 mg | ORAL_TABLET | Freq: Every day | ORAL | Status: DC
  Administered 2020-11-10 – 2020-11-11 (×2): 5 mg via ORAL
  Filled 2020-11-10 (×2): qty 1

## 2020-11-10 MED ORDER — ONDANSETRON HCL 4 MG/2ML IJ SOLN: 4.0000 mg | Freq: Four times a day (QID) | INTRAMUSCULAR | Status: DC | PRN

## 2020-11-10 MED ORDER — ISOSORBIDE MONONITRATE ER 60 MG PO TB24
30.0000 mg | ORAL_TABLET | Freq: Every day | ORAL | Status: DC
  Administered 2020-11-10 – 2020-11-11 (×2): 30 mg via ORAL
  Filled 2020-11-10 (×2): qty 1

## 2020-11-10 MED ORDER — AMLODIPINE BESYLATE 5 MG PO TABS: 5.0000 mg | ORAL_TABLET | Freq: Every day | ORAL | 0 refills | Status: DC

## 2020-11-10 MED ORDER — METOPROLOL SUCCINATE ER 25 MG PO TB24: 12.5000 mg | ORAL_TABLET | Freq: Every day | ORAL | 0 refills | Status: DC

## 2020-11-10 NOTE — ED Notes (Signed)
IV team at bedside 

## 2020-11-10 NOTE — ED Provider Notes (Signed)
HPI: Pt is a 45 y.o. male who presents with complaints of chest pain  The patient p/w  left sided chest pain starting few days ago. Goes the left arm, no numbness  ROS: Denies fever, chest pain, vomiting  Past Medical History:  Diagnosis Date   Hypertension    Prediabetes    Vitals:   11/10/20 0904  BP: (!) 145/105  Pulse: 89  Resp: 18  Temp: 98.3 F (36.8 C)  SpO2: 98%    Focused Physical Exam: Gen: No acute distress Head: atraumatic, normocephalic Eyes: Extraocular movements grossly intact; conjunctiva clear CV: RRR Lung: No increased WOB, no stridor GI: ND, no obvious masses Neuro: Alert and awake  Medical Decision Making and Plan: Given the patient's initial medical screening exam, the following diagnostic evaluation has been ordered. The patient will be placed in the appropriate treatment space, once one is available, to complete the evaluation and treatment. I have discussed the plan of care with the patient and I have advised the patient that an ED physician or mid-level practitioner will reevaluate their condition after the test results have been received, as the results may give them additional insight into the type of treatment they may need.   Diagnostics: labs, xray   Treatments: none immediately   Concha Se, MD 11/10/20 [phone]

## 2020-11-10 NOTE — H&P (Addendum)
History and Physical   Austin Mcconnell WSF:[id number] DOB: 01/23/1975 DOA: 11/10/2020  PCP: Pcp, No  Outpatient Specialists: Dr. Orson Ape, urology [facility]) Patient coming from: home  I have personally briefly reviewed patient's old medical records in [facility] EMR.  Chief Concern: Chest pain  HPI: Austin Mcconnell is a 45 y.o. male with medical history significant for hypertension, who presents emergency department for chief concerns of chest pain.  He reports the chest pain started approximately 3 to 4 days ago.  At its peak the pain is an 8 out of 10 and improves to a 5 out of 10 however does not go away.  He states the pain is pressured, started in the left chest, radiates to his left shoulder and down his left arm.  He endorses associated shortness of breath.    He states he is never felt this way before.  When he woke up this AM, the pain was a squeezing pressure sensation and it scared him prompting him to present to the emergency department for further evaluation.  He reports that he has not taken any of his antihypertensive medications in at least 4 years.  He states that he lost a lot of weight about 4 to 5 years ago, about 30 pounds with diet and exercise and his blood pressure got better and so he stopped taking his blood pressure medications.  Social history: He lives at home by himself.  He currently smokes about 1 to 2 cigarettes/day with an alcoholic beverage.  At his peak he was smoking 4 to 5 cigarettes/day.  He drinks about 1 to twice per week.  And when he does drink he will drink 3-4 beers.  He denies recreational drug use.  He currently works as a Location manager.  Vaccination history: He is vaccinated for COVID-19, with a total of 3 doses from [place]  Family history: No known family history of heart disease.  Patient states that everyone in his family has hypertension including mother and father.  ROS: Constitutional: no weight change, no fever ENT/Mouth: no sore  throat, no rhinorrhea Eyes: no eye pain, no vision changes Cardiovascular: + chest pain, + dyspnea,  no edema, no palpitations Respiratory: no cough, no sputum, no wheezing Gastrointestinal: no nausea, no vomiting, no diarrhea, no constipation Genitourinary: no urinary incontinence, no dysuria, no hematuria Musculoskeletal: no arthralgias, no myalgias Skin: no skin lesions, no pruritus, Neuro: no weakness, no loss of consciousness, no syncope Psych: no anxiety, no depression, no decrease appetite Heme/Lymph: no bruising, no bleeding  ED Course: Discussed with ED provider, patient requiring hospitalization for chief concerns of chest pain.  Vitals in the emergency department was remarkable for temperature of 98.3, respiration rate of 18, heart rate of 89, initial blood pressure 145/105, SPO2 of 98% on room air.  Labs in the emergency department was remarkable for sodium 138, potassium 4, bicarb 26, chloride 103, BUN of 13, serum creatinine of 0.99, nonfasting blood glucose 125, WBC 6.8, hemoglobin 15, platelets 2 8.  GFR is greater than 60.  Troponin was 6 and decreased to 4.  UA was done and had a clear yellow appearance.  Negative for leukocytes and nitrates.  Assessment/Plan  Active Problems:   Chest pain   Essential hypertension   Encounter for tobacco use cessation counseling   Tobacco use   Chest pain-etiology work-up in progress - Patient's labs reassuring with downtrending troponin - However given patient's age and history of hypertension, patient would benefit from inpatient stress test -  We will check lipids in the a.m. - Cardiology has been consulted, Dr. Darrold Junker  Hypertension-counseling given regarding cessation of antihypertensive - Resumed home amlodipine 5 mg daily, isosorbide mononitrate 30 mg daily, metoprolol succinate 12.5 mg p.o. daily - Counseled patient extensively regarding the importance of antihypertensive medication control  Tobacco use Tobacco use  cessation counseling -3 to 5 minutes was spent counseling patient on tobacco cessation  A.m. labs: CBC, BMP, lipid  Chart reviewed.   DVT prophylaxis: Heparin 5000 units subcutaneous every 8 hours Code Status: Full code Diet: Heart healthy Family Communication: No Disposition Plan: Pending clinical course Consults called: Cardiology Admission status: MedSurg, observation, telemetry  Past Medical History:  Diagnosis Date   Hypertension    Prediabetes    Past Surgical History:  Procedure Laterality Date   CARDIAC CATHETERIZATION Left 10/28/2015   Procedure: Left Heart Cath and Coronary Angiography;  Surgeon: Alwyn Pea, MD;  Location: [facility] INVASIVE CV LAB;  Service: Cardiovascular;  Laterality: Left;   Social History:  reports that he has been smoking cigarettes. He has never used smokeless tobacco. He reports current alcohol use. He reports that he does not use drugs.  No Known Allergies Family History  Problem Relation Age of Onset   Hypertension Mother    Hypertension Father    Family history: Family history reviewed and not pertinent  Prior to Admission medications   Medication Sig Start Date End Date Taking? Authorizing Provider  amLODipine (NORVASC) 5 MG tablet Take 1 tablet (5 mg total) by mouth daily. 11/10/20 12/10/20  Concha Se, MD  isosorbide mononitrate (IMDUR) 30 MG 24 hr tablet Take 30 mg by mouth daily. Patient not taking: Reported on 08/19/2020    [provider]  metoprolol succinate (TOPROL-XL) 25 MG 24 hr tablet Take 0.5 tablets (12.5 mg total) by mouth daily. 11/10/20 12/10/20  Concha Se, MD   Physical Exam: Vitals:   11/10/20 0904 11/10/20 1404  BP: (!) 145/105 (!) 140/105  Pulse: 89 91  Resp: 18 16  Temp: 98.3 F (36.8 C) 98.1 F (36.7 C)  TempSrc: Oral Oral  SpO2: 98% 100%  Weight: 95.3 kg   Height: 5\' 6"  (1.676 m)    Constitutional: appears age-appropriate, NAD, calm, comfortable Eyes: PERRL, lids and conjunctivae  normal ENMT: Mucous membranes are moist. Posterior pharynx clear of any exudate or lesions. Age-appropriate dentition. Hearing appropriate/loss Neck: normal, supple, no masses, no thyromegaly Respiratory: clear to auscultation bilaterally, no wheezing, no crackles. Normal respiratory effort. No accessory muscle use.  Cardiovascular: Regular rate and rhythm, no murmurs / rubs / gallops. No extremity edema. 2+ pedal pulses. No carotid bruits.  Abdomen: Obese abdomen, no tenderness, no masses palpated, no hepatosplenomegaly. Bowel sounds positive.  Musculoskeletal: no clubbing / cyanosis. No joint deformity upper and lower extremities. Good ROM, no contractures, no atrophy. Normal muscle tone.  Skin: no rashes, lesions, ulcers. No induration Neurologic: Sensation intact. Strength 5/5 in all 4.  Psychiatric: Normal judgment and insight. Alert and oriented x 3. Normal mood.   EKG: independently reviewed, showing sinus rhythm with rate of 87, QTc 425, LVH.  Chest x-ray on Admission: I personally reviewed and I agree with radiologist reading as below.  DG Chest 2 View  Result Date: 11/10/2020 CLINICAL DATA:  Chest pain EXAM: CHEST - 2 VIEW COMPARISON:  01/02/2015 FINDINGS: The heart size and mediastinal contours are within normal limits. Both lungs are clear. The visualized skeletal structures are unremarkable. IMPRESSION: No active cardiopulmonary disease. Electronically Signed  By: Marlan Palau M.D.   On: 11/10/2020 09:48    Labs on Admission: I have personally reviewed following labs  CBC: Recent Labs  Lab 11/10/20 0908  WBC 6.8  HGB 15.0  HCT 45.1  MCV 88.8  PLT 208   Basic Metabolic Panel: Recent Labs  Lab 11/10/20 0908  NA 138  K 4.0  CL 103  CO2 26  GLUCOSE 125*  BUN 13  CREATININE 0.99  CALCIUM 9.1   GFR: Estimated Creatinine Clearance: 99.7 mL/min (by C-G formula based on SCr of 0.99 mg/dL).  Urine analysis:    Component Value Date/Time   COLORURINE YELLOW (A)  10/13/2020 1824   APPEARANCEUR CLEAR (A) 10/13/2020 1824   LABSPEC 1.024 10/13/2020 1824   PHURINE 5.0 10/13/2020 1824   GLUCOSEU NEGATIVE 10/13/2020 1824   HGBUR NEGATIVE 10/13/2020 1824   BILIRUBINUR NEGATIVE 10/13/2020 1824   KETONESUR NEGATIVE 10/13/2020 1824   PROTEINUR NEGATIVE 10/13/2020 1824   NITRITE NEGATIVE 10/13/2020 1824   LEUKOCYTESUR NEGATIVE 10/13/2020 1824   Dr. Sedalia Muta [facility]  If 7PM-7AM, please contact overnight-coverage provider If 7AM-7PM, please contact day coverage provider [email or web address]  11/10/2020, 4:02 PM

## 2020-11-10 NOTE — ED Notes (Signed)
NPO AFTER MIDNIGHT FOR 0830 PROCEDURE

## 2020-11-10 NOTE — ED Triage Notes (Signed)
Pt here with CP that started a few days ago. Pt states pain is left sided and radiates to his left arm and neck. Pt states pain is intermittent but the last occurrence was sharper than usual. Pt in NAD in triage.

## 2020-11-10 NOTE — ED Notes (Signed)
Pt. Placed on cont. Cardiac monitoring.  

## 2020-11-10 NOTE — ED Provider Notes (Signed)
[facility] Emergency Department Provider Note  ____________________________________________   Event Date/Time   First MD Initiated Contact with Patient 11/10/20 [phone]     (approximate)  I have reviewed the triage vital signs and the nursing notes.   HISTORY  Chief Complaint Chest Pain    HPI Austin Mcconnell is a 45 y.o. male with HTN not on medications who comes in with chest pain.  The chest pain started a few days ago, constant but then will get better or worse. Its non exertional in nature. No SOB currently. No blood clots, no leg swelling, no recent long travel, no recent surgery.  No history of chest pain, no history of heart attacks. Saw cardiolgist 2017. Had normal heart cath October 2017.           Past Medical History:  Diagnosis Date   Hypertension    Prediabetes     There are no problems to display for this patient.   Past Surgical History:  Procedure Laterality Date   CARDIAC CATHETERIZATION Left 10/28/2015   Procedure: Left Heart Cath and Coronary Angiography;  Surgeon: Alwyn Pea, MD;  Location: [facility] INVASIVE CV LAB;  Service: Cardiovascular;  Laterality: Left;    Prior to Admission medications   Medication Sig Start Date End Date Taking? Authorizing Provider  amLODipine (NORVASC) 5 MG tablet Take 5 mg by mouth daily. Patient not taking: Reported on 08/19/2020    [provider]  isosorbide mononitrate (IMDUR) 30 MG 24 hr tablet Take 30 mg by mouth daily. Patient not taking: Reported on 08/19/2020    [provider]  metoprolol succinate (TOPROL-XL) 25 MG 24 hr tablet Take 12.5 mg by mouth daily. Patient not taking: Reported on 08/19/2020    [provider]    Allergies Patient has no known allergies.  No family history on file.  Social History Social History   Tobacco Use   Smoking status: Every Day    Types: Cigarettes   Smokeless tobacco: Never  Substance Use Topics   Alcohol use: Yes     Comment: occasional beer   Drug use: No      Review of Systems Constitutional: No fever/chills Eyes: No visual changes. ENT: No sore throat. Cardiovascular: Positive chest pain Respiratory: Denies shortness of breath. Gastrointestinal: No abdominal pain.  No nausea, no vomiting.  No diarrhea.  No constipation. Genitourinary: Negative for dysuria. Musculoskeletal: Negative for back pain. Skin: Negative for rash. Neurological: Negative for headaches, focal weakness or numbness. All other ROS negative ____________________________________________   PHYSICAL EXAM:  VITAL SIGNS: ED Triage Vitals [11/10/20 0904]  Enc Vitals Group     BP (!) 145/105     Pulse Rate 89     Resp 18     Temp 98.3 F (36.8 C)     Temp Source Oral     SpO2 98 %     Weight 210 lb (95.3 kg)     Height 5\' 6"  (1.676 m)     Head Circumference      Peak Flow      Pain Score 8     Pain Loc      Pain Edu?      Excl. in GC?     Constitutional: Alert and oriented. Well appearing and in no acute distress. Eyes: Conjunctivae are normal. EOMI. Head: Atraumatic. Nose: No congestion/rhinnorhea. Mouth/Throat: Mucous membranes are moist.   Neck: No stridor. Trachea Midline. FROM Cardiovascular: Normal rate, regular rhythm. Grossly normal heart sounds.  Good peripheral circulation. Respiratory: Normal respiratory effort.  No retractions. Lungs CTAB. Gastrointestinal: Soft and nontender. No distention. No abdominal bruits.  Musculoskeletal: No lower extremity tenderness nor edema.  No joint effusions. Neurologic:  Normal speech and language. No gross focal neurologic deficits are appreciated.  Skin:  Skin is warm, dry and intact. No rash noted. Psychiatric: Mood and affect are normal. Speech and behavior are normal. GU: Deferred   ____________________________________________   LABS (all labs ordered are listed, but only abnormal results are displayed)  Labs Reviewed  BASIC METABOLIC PANEL -  Abnormal; Notable for the following components:      Result Value   Glucose, Bld 125 (*)    All other components within normal limits  CBC  TROPONIN I (HIGH SENSITIVITY)  TROPONIN I (HIGH SENSITIVITY)   ____________________________________________   ED ECG REPORT I, Concha Se, the attending physician, personally viewed and interpreted this ECG.  Q waves inferior leads, t wave inversion lead 3, no st elevation, normal intervals. ____________________________________________  RADIOLOGY Vela Prose, personally viewed and evaluated these images (plain radiographs) as part of my medical decision making, as well as reviewing the written report by the radiologist.  ED MD interpretation:  no pna   Official radiology report(s): DG Chest 2 View  Result Date: 11/10/2020 CLINICAL DATA:  Chest pain EXAM: CHEST - 2 VIEW COMPARISON:  01/02/2015 FINDINGS: The heart size and mediastinal contours are within normal limits. Both lungs are clear. The visualized skeletal structures are unremarkable. IMPRESSION: No active cardiopulmonary disease. Electronically Signed   By: Marlan Palau M.D.   On: 11/10/2020 09:48    ____________________________________________   PROCEDURES  Procedure(s) performed (including Critical Care):  Procedures   ____________________________________________   INITIAL IMPRESSION / ASSESSMENT AND PLAN / ED COURSE   Austin Mcconnell was evaluated in Emergency Department on 11/10/2020 for the symptoms described in the history of present illness. He was evaluated in the context of the global COVID-19 pandemic, which necessitated consideration that the patient might be at risk for infection with the SARS-CoV-2 virus that causes COVID-19. Institutional protocols and algorithms that pertain to the evaluation of patients at risk for COVID-19 are in a state of rapid change based on information released by regulatory bodies including the CDC and federal and state organizations.  These policies and algorithms were followed during the patient's care in the ED.    Most Likely DDx:  -ACS   DDx that was also considered d/t potential to cause harm, but was found less likely based on history and physical (as detailed above): -PNA (no fevers, cough but CXR to evaluate) -PNX (reassured with equal b/l breath sounds, CXR to evaluate) -Symptomatic anemia (will get H&H) -Pulmonary embolism as no sob at rest, not pleuritic in nature, no hypoxia -Aortic Dissection as no tearing pain and no radiation to the mid back, pulses equal -Pericarditis no rub on exam, EKG changes or hx to suggest dx -Tamponade (no notable SOB, tachycardic, hypotensive) -Esophageal rupture (no h/o diffuse vomitting/no crepitus)  D/w cardiology- unable to see prior EKGS.   Trop negative but given continued pain and q waves inferior leads will d/w medicine for admission given pt does not feel comfortable going home and f/u outpt with cards.        ____________________________________________   FINAL CLINICAL IMPRESSION(S) / ED DIAGNOSES   Final diagnoses:  Chest pain, unspecified type     MEDICATIONS GIVEN DURING THIS VISIT:  Medications - No data to display  ED Discharge Orders          Ordered    amLODipine (NORVASC) 5 MG tablet  Daily        11/10/20 1348    metoprolol succinate (TOPROL-XL) 25 MG 24 hr tablet  Daily        11/10/20 1348             Note:  This document was prepared using Dragon voice recognition software and may include unintentional dictation errors.    Concha Se, MD 11/10/20 [phone]

## 2020-11-10 NOTE — ED Notes (Signed)
Pt. Arrived to ED 1 H.

## 2020-11-10 NOTE — ED Notes (Signed)
Pt. Laying in stretcher, sleeping. Chest rise and fall, eyes closed.

## 2020-11-10 NOTE — ED Notes (Signed)
Pt. Ate complete dinner tray

## 2020-11-11 ENCOUNTER — Observation Stay: Payer: Self-pay

## 2020-11-11 LAB — CBC
HCT: 42.5 % (ref 39.0–52.0)
Hemoglobin: 14 g/dL (ref 13.0–17.0)
MCH: 28.7 pg (ref 26.0–34.0)
MCHC: 32.9 g/dL (ref 30.0–36.0)
MCV: 87.3 fL (ref 80.0–100.0)
Platelets: 199 10*3/uL (ref 150–400)
RBC: 4.87 MIL/uL (ref 4.22–5.81)
RDW: 13.5 % (ref 11.5–15.5)
WBC: 8.4 10*3/uL (ref 4.0–10.5)
nRBC: 0 % (ref 0.0–0.2)

## 2020-11-11 LAB — BASIC METABOLIC PANEL
Anion gap: 7 (ref 5–15)
BUN: 18 mg/dL (ref 6–20)
CO2: 26 mmol/L (ref 22–32)
Calcium: 8.8 mg/dL — ABNORMAL LOW (ref 8.9–10.3)
Chloride: 108 mmol/L (ref 98–111)
Creatinine, Ser: 0.92 mg/dL (ref 0.61–1.24)
GFR, Estimated: >60 mL/min (ref >60)
Glucose, Bld: 134 mg/dL — ABNORMAL HIGH (ref 70–99)
Potassium: 3.6 mmol/L (ref 3.5–5.1)
Sodium: 141 mmol/L (ref 135–145)

## 2020-11-11 LAB — LIPID PANEL
Cholesterol: 167 mg/dL (ref 0–200)
HDL: 30 mg/dL — ABNORMAL LOW (ref >40)
LDL Cholesterol: 87 mg/dL (ref 0–99)
Total CHOL/HDL Ratio: 5.6 RATIO
Triglycerides: 248 mg/dL — ABNORMAL HIGH (ref <150)
VLDL: 50 mg/dL — ABNORMAL HIGH (ref 0–40)

## 2020-11-11 LAB — D-DIMER, QUANTITATIVE: D-Dimer, Quant: 0.28 ug/mL-FEU (ref 0.00–0.50)

## 2020-11-11 MED ORDER — ASPIRIN 81 MG PO TBEC: 81.0000 mg | DELAYED_RELEASE_TABLET | Freq: Every day | ORAL | 2 refills | Status: AC

## 2020-11-11 MED ORDER — ASPIRIN EC 81 MG PO TBEC
81.0000 mg | DELAYED_RELEASE_TABLET | Freq: Every day | ORAL | Status: DC
  Administered 2020-11-11: 81 mg via ORAL
  Filled 2020-11-11: qty 1

## 2020-11-11 MED ORDER — ACETAMINOPHEN 325 MG PO TABS: 650.0000 mg | ORAL_TABLET | ORAL | Status: DC | PRN

## 2020-11-11 MED ORDER — METOPROLOL SUCCINATE ER 25 MG PO TB24: 12.5000 mg | ORAL_TABLET | Freq: Every day | ORAL | 1 refills | Status: DC

## 2020-11-11 MED ORDER — PRAVASTATIN SODIUM 20 MG PO TABS: 20.0000 mg | ORAL_TABLET | Freq: Every day | ORAL | Status: DC

## 2020-11-11 MED ORDER — ISOSORBIDE MONONITRATE ER 30 MG PO TB24: 30.0000 mg | ORAL_TABLET | Freq: Every day | ORAL | 2 refills | Status: DC

## 2020-11-11 MED ORDER — PRAVASTATIN SODIUM 20 MG PO TABS: 20.0000 mg | ORAL_TABLET | Freq: Every day | ORAL | 2 refills | Status: DC

## 2020-11-11 MED ORDER — REGADENOSON 0.4 MG/5ML IV SOLN
0.4000 mg | Freq: Once | INTRAVENOUS | Status: AC
  Administered 2020-11-11: 0.4 mg via INTRAVENOUS
  Filled 2020-11-11: qty 5

## 2020-11-11 MED ORDER — TECHNETIUM TC 99M TETROFOSMIN IV KIT
10.0000 | PACK | Freq: Once | INTRAVENOUS | Status: AC | PRN
  Administered 2020-11-11: 10.64 via INTRAVENOUS

## 2020-11-11 MED ORDER — TECHNETIUM TC 99M TETROFOSMIN IV KIT
30.0000 | PACK | Freq: Once | INTRAVENOUS | Status: AC | PRN
  Administered 2020-11-11: 32.1 via INTRAVENOUS

## 2020-11-11 MED ORDER — AMLODIPINE BESYLATE 5 MG PO TABS: 5.0000 mg | ORAL_TABLET | Freq: Every day | ORAL | 2 refills | Status: DC

## 2020-11-11 NOTE — Consult Note (Signed)
[facility]  CARDIOLOGY CONSULT NOTE  Patient ID: Deagen Krass MRN: [id number] DOB/AGE: 07/23/1975 45 y.o.  Admit date: 11/10/2020 Referring Physician Esaw Grandchild Primary Physician Pcp, No Primary Cardiologist Dorothyann Peng Reason for Consultation Chest pain  HPI:  Austin Mcconnell is a 45 year old male with a history of hypertension, prediabetes who was admitted to the hospital with chief complaint of chest pain.  He says he has been having chest pain for 3 to 4 days now, which is 8 out of 10 in severity at its worst and occasionally improves to 5 out of 10 in severity.  It is not completely gone away since it started.  The pain starts in his chest and radiates down his left arm and he has some shortness of breath associated with this.  Yesterday morning he awoke with a squeezing pressure sensation that prompted him to present to the emergency department.  Notably he has not been compliant with his antihypertensive medicines in the last 3 to 4 years.  On arrival to the emergency department he was initially hypertensive with a blood pressure of 145/105 which is subsequently improved.  His heart rate was in the 70s to 80s.  His high-sensitivity troponin 6 --> 4.  EKG showed an inferior infarct; this appears unchanged compared to his prior ECG in 2017 at [facility].  Notably, he was evaluated by Dr. Juliann Pares in 2017 for chest pain and had a normal nuclear medicine stress test, as well as a normal echocardiogram at that time.  He subsequently underwent a left heart catheterization which showed normal coronary arteries.  Social -He smokes 1 to 2 cigarettes/day. -Has some binge drinking tendencies.  Family -No significant history.  Review of systems complete and found to be negative unless listed above     Past Medical History:  Diagnosis Date   Hypertension    Prediabetes     Past Surgical History:  Procedure Laterality Date   CARDIAC CATHETERIZATION Left 10/28/2015   Procedure: Left  Heart Cath and Coronary Angiography;  Surgeon: Alwyn Pea, MD;  Location: [facility] INVASIVE CV LAB;  Service: Cardiovascular;  Laterality: Left;    (Not in a hospital admission)  Social History   Socioeconomic History   Marital status: Single    Spouse name: Not on file   Number of children: Not on file   Years of education: Not on file   Highest education level: Not on file  Occupational History   Not on file  Tobacco Use   Smoking status: Every Day    Types: Cigarettes   Smokeless tobacco: Never  Substance and Sexual Activity   Alcohol use: Yes    Comment: occasional beer   Drug use: No   Sexual activity: Yes    Partners: Female  Other Topics Concern   Not on file  Social History Narrative   Not on file   Social Determinants of Health   Financial Resource Strain: Not on file  Food Insecurity: Not on file  Transportation Needs: Not on file  Physical Activity: Not on file  Stress: Not on file  Social Connections: Not on file  Intimate Partner Violence: Not on file    Family History  Problem Relation Age of Onset   Hypertension Mother    Hypertension Father       Review of systems complete and found to be negative unless listed above      PHYSICAL EXAM  General: Well developed, well nourished, in no acute distress HEENT:  Normocephalic and  atramatic Neck:  No JVD.  Lungs: Clear bilaterally to auscultation and percussion. Heart: HRRR . Normal S1 and S2 without gallops or murmurs.  Abdomen: Bowel sounds are positive, abdomen soft and non-tender  Msk:  Back normal, normal gait. Normal strength and tone for age. Extremities: No clubbing, cyanosis or edema.   Neuro: Alert and oriented X 3. Psych:  Good affect, responds appropriately  Labs:   Lab Results  Component Value Date   WBC 8.4 11/11/2020   HGB 14.0 11/11/2020   HCT 42.5 11/11/2020   MCV 87.3 11/11/2020   PLT 199 11/11/2020    Recent Labs  Lab 11/11/20 0610  NA 141  K 3.6  CL 108   CO2 26  BUN 18  CREATININE 0.92  CALCIUM 8.8*  GLUCOSE 134*   Lab Results  Component Value Date   TROPONINI <0.03 01/02/2015    Lab Results  Component Value Date   CHOL 167 11/11/2020   Lab Results  Component Value Date   HDL 30 (L) 11/11/2020   Lab Results  Component Value Date   LDLCALC 87 11/11/2020   Lab Results  Component Value Date   TRIG 248 (H) 11/11/2020   Lab Results  Component Value Date   CHOLHDL 5.6 11/11/2020   No results found for: LDLDIRECT    Radiology: DG Chest 2 View  Result Date: 11/10/2020 CLINICAL DATA:  Chest pain EXAM: CHEST - 2 VIEW COMPARISON:  01/02/2015 FINDINGS: The heart size and mediastinal contours are within normal limits. Both lungs are clear. The visualized skeletal structures are unremarkable. IMPRESSION: No active cardiopulmonary disease. Electronically Signed   By: Marlan Palau M.D.   On: 11/10/2020 09:48    EKG: NSR. Inferior and lateral q waves.   Echo: 2017 Normal RV/LV function. No valvular stenosis  Stress: 2017- Normal SPECT.   ASSESSMENT AND PLAN:    # Chest pain Patient presents with 3 to 4 days of constant chest pain that waxes and wanes in severity.  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  He reassuringly has had a normal work-up in the past including a normal cardiac catheterization.  His high-sensitivity troponin is negative.  He does have risk factors for coronary disease including tobacco use, hypertension, prediabetes. -Start aspirin 81 mg -Continue metoprolol 12.5 mg daily -Lipid panel-TC 167, LDL 87, HDL 30, TG 248 - Check A1c - The 10-year ASCVD risk score (Arnett DK, et al., 2019) is: 9.8%   Values used to calculate the score:     Age: 45 years     Sex: Male     Is Non-Hispanic African American: Yes     Diabetic: No     Tobacco smoker: Yes     Systolic Blood Pressure: 105 mmHg     Is BP treated: Yes     HDL Cholesterol: 30 mg/dL     Total Cholesterol: 167 mg/dL -  Recommend low intensity statin- pravastatin 20 mg - NM stress test scheduled for today- Completed. There is no significant ischemia noted.  - OK for discharge with outpatient follow up in 1-2 weeks.    Signed: Armando Reichert MD 11/11/2020, 8:00 AM

## 2020-11-11 NOTE — Discharge Summary (Signed)
Physician Discharge Summary  Austin Mcconnell DVV:[id number] DOB: Mar 29, 1975 DOA: 11/10/2020  PCP: Pcp, No  Admit date: 11/10/2020 Discharge date: 11/11/2020  Admitted From: home Disposition:  home  Recommendations for Outpatient Follow-up:  Follow up with PCP in 1-2 weeks Please obtain BMP/CBC in one week Please follow up with cardiology in 1-2 weeks  Home Health: no  Equipment/Devices: none   Discharge Condition: stable  CODE STATUS: full  Diet recommendation: Heart Healthy   Discharge Diagnoses: Active Problems:   Chest pain   Essential hypertension   Encounter for tobacco use cessation counseling   Tobacco use    Summary of HPI and Hospital Course:  Per H&P by Dr. Sedalia Muta: "Austin Mcconnell is a 45 y.o. male with medical history significant for hypertension, who presents emergency department for chief concerns of chest pain.   He reports the chest pain started approximately 3 to 4 days ago.  At its peak the pain is an 8 out of 10 and improves to a 5 out of 10 however does not go away.  He states the pain is pressured, started in the left chest, radiates to his left shoulder and down his left arm.  He endorses associated shortness of breath.     He states he is never felt this way before.   When he woke up this AM, the pain was a squeezing pressure sensation and it scared him prompting him to present to the emergency department for further evaluation.   He reports that he has not taken any of his antihypertensive medications in at least 4 years.  He states that he lost a lot of weight about 4 to 5 years ago, about 30 pounds with diet and exercise and his blood pressure got better and so he stopped taking his blood pressure medications."   Patient was admitted and seen by cardiology.  Patient's chest pain improved, did report some residual left upper chest / shoulder discomfort.  Cardiology's assessment and recommendations as follows:  "Patient presents with 3 to 4 days of  constant chest pain that waxes and wanes in severity.  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  He reassuringly has had a normal work-up in the past including a normal cardiac catheterization.  His high-sensitivity troponin is negative.  He does have risk factors for coronary disease including tobacco use, hypertension, prediabetes. -Start aspirin 81 mg -Continue metoprolol 12.5 mg daily -Lipid panel-TC 167, LDL 87, HDL 30, TG 248 - Check A1c - The 10-year ASCVD risk score (Arnett DK, et al., 2019) is: 9.8%   Values used to calculate the score:     Age: 29 years     Sex: Male     Is Non-Hispanic African American: Yes     Diabetic: No     Tobacco smoker: Yes     Systolic Blood Pressure: 105 mmHg     Is BP treated: Yes     HDL Cholesterol: 30 mg/dL     Total Cholesterol: 167 mg/dL - Recommend low intensity statin- pravastatin 20 mg - NM stress test scheduled for today- Completed. There is no significant ischemia noted.  - OK for discharge with outpatient follow up in 1-2 weeks"   D-dimer was checked and negative, ruling out DVT/PE as etiology of patient's chest pain.    Patient is clinically stable for d/c and outpatient follow up with PCP and cardiology.   Discharge Instructions   Discharge Instructions     Call MD for:  Complete by: As directed    Recurrent or persistent chest pain especially if it happens with physical exertion. Worsening shortness of breath. Blood pressure running above 140/90 or below 110/60   Call MD for:  extreme fatigue   Complete by: As directed    Call MD for:  persistant dizziness or light-headedness   Complete by: As directed    Call MD for:  persistant nausea and vomiting   Complete by: As directed    Call MD for:  severe uncontrolled pain   Complete by: As directed    Call MD for:  temperature >100.4   Complete by: As directed    Diet - low sodium heart healthy   Complete by: As directed    Discharge  instructions   Complete by: As directed    Your stress test was normal - it did not show any signs of blockages in the arteries of your heart.  We also ruled out blood clot as a cause of your chest pain.  The lab I ordered this afternoon (D-dimer) was normal, so there is no need to do further testing to look for blood clots.  This is a very accurate lab when it is normal.  Your chest pain was most likely related to your blood pressure being uncontrolled.  Please be sure to take all your medications as prescribed. I'd recommend checking your blood pressure at home and write them down to bring to follow up doctor's appointments.    You can follow up with Dr. Juliann Pares to further discuss the stress test results, if you wish.  He can also help to be sure your BP is controlled and make any other changes to medications.    --Dr. Esaw Grandchild, DO   [facility]   Increase activity slowly   Complete by: As directed       Allergies as of 11/11/2020   No Known Allergies      Medication List     TAKE these medications    acetaminophen 325 MG tablet Commonly known as: TYLENOL Take 2 tablets (650 mg total) by mouth every 4 (four) hours as needed for headache or mild pain.   amLODipine 5 MG tablet Commonly known as: NORVASC Take 1 tablet (5 mg total) by mouth daily. Start taking on: November 12, 2020   aspirin 81 MG EC tablet Take 1 tablet (81 mg total) by mouth daily. Swallow whole. Start taking on: November 12, 2020   isosorbide mononitrate 30 MG 24 hr tablet Commonly known as: IMDUR Take 1 tablet (30 mg total) by mouth daily. Start taking on: November 12, 2020   metoprolol succinate 25 MG 24 hr tablet Commonly known as: TOPROL-XL Take 0.5 tablets (12.5 mg total) by mouth daily. Start taking on: November 12, 2020   pravastatin 20 MG tablet Commonly known as: PRAVACHOL Take 1 tablet (20 mg total) by mouth daily at 6 PM.        No Known Allergies   If you  experience worsening of your admission symptoms, develop shortness of breath, life threatening emergency, suicidal or homicidal thoughts you must seek medical attention immediately by calling 911 or calling your MD immediately  if symptoms less severe.    Please note   You were cared for by a hospitalist during your hospital stay. If you have any questions about your discharge medications or the care you received while you were in the hospital after you are discharged, you can call the unit and asked  to speak with the hospitalist on call if the hospitalist that took care of you is not available. Once you are discharged, your primary care physician will handle any further medical issues. Please note that NO REFILLS for any discharge medications will be authorized once you are discharged, as it is imperative that you return to your primary care physician (or establish a relationship with a primary care physician if you do not have one) for your aftercare needs so that they can reassess your need for medications and monitor your lab values.   Consultations: Cardiology    Procedures/Studies: DG Chest 2 View  Result Date: 11/10/2020 CLINICAL DATA:  Chest pain EXAM: CHEST - 2 VIEW COMPARISON:  01/02/2015 FINDINGS: The heart size and mediastinal contours are within normal limits. Both lungs are clear. The visualized skeletal structures are unremarkable. IMPRESSION: No active cardiopulmonary disease. Electronically Signed   By: Marlan Palau M.D.   On: 11/10/2020 09:48   NM Myocar Multi W/Spect W/Wall Motion / EF  Result Date: 11/11/2020   ECG rhythm shows normal sinus rhythm. Inferior q waves present.   No ST deviation was noted.   LV perfusion is normal. There is no evidence of ischemia. There is no evidence of infarction.   Left ventricular function is normal. End diastolic cavity size is normal.   Prior study not available for comparison. Conclusion: Low risk myocardial perfusion study. There is no  significant ischemia noted. There is a small in size, mild in severity fixed defect of the apical region which is most likely consistent with artifact. Normal LV systolic function. Significant gut uptake and motion limit interpretation to some degree. No coronary calcium.      Subjective: Pt seen in the ED holding for a bed.  Reports chest pain improved, having some residual discomfort at left upper chest / anterior shoulder.  No F/C, cough or congestion, no SOB.  No other acute complaints.  Had completed nuc stress test and awaiting results from cardiology at time of my encounter.    Discharge Exam: Vitals:   11/11/20 1237 11/11/20 1353  BP: 115/74 129/81  Pulse: 96 95  Resp:  18  Temp:    SpO2:  100%   Vitals:   11/11/20 1028 11/11/20 1117 11/11/20 1237 11/11/20 1353  BP: 129/84 124/88 115/74 129/81  Pulse: (!) 101 73 96 95  Resp: 18 18  18   Temp:      TempSrc:      SpO2: 99%   100%  Weight:      Height:        General: Pt is alert, awake, not in acute distress Cardiovascular: RRR, S1/S2 +, no rubs, no gallops Respiratory: CTA bilaterally, no wheezing, no rhonchi Abdominal: Soft, NT, ND, bowel sounds + Extremities: no edema, no cyanosis    The results of significant diagnostics from this hospitalization (including imaging, microbiology, ancillary and laboratory) are listed below for reference.     Microbiology: Recent Results (from the past 240 hour(s))  Resp Panel by RT-PCR (Flu A&B, Covid) Nasopharyngeal Swab     Status: None   Collection Time: 11/10/20  2:31 PM   Specimen: Nasopharyngeal Swab; Nasopharyngeal(NP) swabs in vial transport medium  Result Value Ref Range Status   SARS Coronavirus 2 by RT PCR NEGATIVE NEGATIVE Final    Comment: (NOTE) SARS-CoV-2 target nucleic acids are NOT DETECTED.  The SARS-CoV-2 RNA is generally detectable in upper respiratory specimens during the acute phase of infection. The lowest concentration of SARS-CoV-2 viral  copies this  assay can detect is 138 copies/mL. A negative result does not preclude SARS-Cov-2 infection and should not be used as the sole basis for treatment or other patient management decisions. A negative result may occur with  improper specimen collection/handling, submission of specimen other than nasopharyngeal swab, presence of viral mutation(s) within the areas targeted by this assay, and inadequate number of viral copies(<138 copies/mL). A negative result must be combined with clinical observations, patient history, and epidemiological information. The expected result is Negative.  Fact Sheet for Patients:  BloggerCourse.com  Fact Sheet for Healthcare Providers:  SeriousBroker.it  This test is no t yet approved or cleared by the [place] FDA and  has been authorized for detection and/or diagnosis of SARS-CoV-2 by FDA under an Emergency Use Authorization (EUA). This EUA will remain  in effect (meaning this test can be used) for the duration of the COVID-19 declaration under Section 564(b)(1) of the Act, 21 U.S.C.section 360bbb-3(b)(1), unless the authorization is terminated  or revoked sooner.       Influenza A by PCR NEGATIVE NEGATIVE Final   Influenza B by PCR NEGATIVE NEGATIVE Final    Comment: (NOTE) The Xpert Xpress SARS-CoV-2/FLU/RSV plus assay is intended as an aid in the diagnosis of influenza from Nasopharyngeal swab specimens and should not be used as a sole basis for treatment. Nasal washings and aspirates are unacceptable for Xpert Xpress SARS-CoV-2/FLU/RSV testing.  Fact Sheet for Patients: BloggerCourse.com  Fact Sheet for Healthcare Providers: SeriousBroker.it  This test is not yet approved or cleared by the [place] FDA and has been authorized for detection and/or diagnosis of SARS-CoV-2 by FDA under an Emergency Use Authorization (EUA). This EUA will  remain in effect (meaning this test can be used) for the duration of the COVID-19 declaration under Section 564(b)(1) of the Act, 21 U.S.C. section 360bbb-3(b)(1), unless the authorization is terminated or revoked.  Performed at [facility], [address] [place], [place] [address]      Labs: BNP (last 3 results) No results for input(s): BNP in the last 8760 hours. Basic Metabolic Panel: Recent Labs  Lab 11/10/20 0908 11/11/20 0610  NA 138 141  K 4.0 3.6  CL 103 108  CO2 26 26  GLUCOSE 125* 134*  BUN 13 18  CREATININE 0.99 0.92  CALCIUM 9.1 8.8*   Liver Function Tests: No results for input(s): AST, ALT, ALKPHOS, BILITOT, PROT, ALBUMIN in the last 168 hours. No results for input(s): LIPASE, AMYLASE in the last 168 hours. No results for input(s): AMMONIA in the last 168 hours. CBC: Recent Labs  Lab 11/10/20 0908 11/11/20 0610  WBC 6.8 8.4  HGB 15.0 14.0  HCT 45.1 42.5  MCV 88.8 87.3  PLT 208 199   Cardiac Enzymes: No results for input(s): CKTOTAL, CKMB, CKMBINDEX, TROPONINI in the last 168 hours. BNP: Invalid input(s): POCBNP CBG: No results for input(s): GLUCAP in the last 168 hours. D-Dimer Recent Labs    11/11/20 1446  DDIMER 0.28   Hgb A1c No results for input(s): HGBA1C in the last 72 hours. Lipid Profile Recent Labs    11/11/20 0610  CHOL 167  HDL 30*  LDLCALC 87  TRIG 027*  CHOLHDL 5.6   Thyroid function studies No results for input(s): TSH, T4TOTAL, T3FREE, THYROIDAB in the last 72 hours.  Invalid input(s): FREET3 Anemia work up No results for input(s): VITAMINB12, FOLATE, FERRITIN, TIBC, IRON, RETICCTPCT in the last 72 hours. Urinalysis    Component Value Date/Time  COLORURINE YELLOW (A) 10/13/2020 1824   APPEARANCEUR CLEAR (A) 10/13/2020 1824   LABSPEC 1.024 10/13/2020 1824   PHURINE 5.0 10/13/2020 1824   GLUCOSEU NEGATIVE 10/13/2020 1824   HGBUR NEGATIVE 10/13/2020 1824   BILIRUBINUR NEGATIVE 10/13/2020 1824    KETONESUR NEGATIVE 10/13/2020 1824   PROTEINUR NEGATIVE 10/13/2020 1824   NITRITE NEGATIVE 10/13/2020 1824   LEUKOCYTESUR NEGATIVE 10/13/2020 1824   Sepsis Labs Invalid input(s): PROCALCITONIN,  WBC,  LACTICIDVEN Microbiology Recent Results (from the past 240 hour(s))  Resp Panel by RT-PCR (Flu A&B, Covid) Nasopharyngeal Swab     Status: None   Collection Time: 11/10/20  2:31 PM   Specimen: Nasopharyngeal Swab; Nasopharyngeal(NP) swabs in vial transport medium  Result Value Ref Range Status   SARS Coronavirus 2 by RT PCR NEGATIVE NEGATIVE Final    Comment: (NOTE) SARS-CoV-2 target nucleic acids are NOT DETECTED.  The SARS-CoV-2 RNA is generally detectable in upper respiratory specimens during the acute phase of infection. The lowest concentration of SARS-CoV-2 viral copies this assay can detect is 138 copies/mL. A negative result does not preclude SARS-Cov-2 infection and should not be used as the sole basis for treatment or other patient management decisions. A negative result may occur with  improper specimen collection/handling, submission of specimen other than nasopharyngeal swab, presence of viral mutation(s) within the areas targeted by this assay, and inadequate number of viral copies(<138 copies/mL). A negative result must be combined with clinical observations, patient history, and epidemiological information. The expected result is Negative.  Fact Sheet for Patients:  BloggerCourse.com  Fact Sheet for Healthcare Providers:  SeriousBroker.it  This test is no t yet approved or cleared by the [place] FDA and  has been authorized for detection and/or diagnosis of SARS-CoV-2 by FDA under an Emergency Use Authorization (EUA). This EUA will remain  in effect (meaning this test can be used) for the duration of the COVID-19 declaration under Section 564(b)(1) of the Act, 21 U.S.C.section 360bbb-3(b)(1), unless the  authorization is terminated  or revoked sooner.       Influenza A by PCR NEGATIVE NEGATIVE Final   Influenza B by PCR NEGATIVE NEGATIVE Final    Comment: (NOTE) The Xpert Xpress SARS-CoV-2/FLU/RSV plus assay is intended as an aid in the diagnosis of influenza from Nasopharyngeal swab specimens and should not be used as a sole basis for treatment. Nasal washings and aspirates are unacceptable for Xpert Xpress SARS-CoV-2/FLU/RSV testing.  Fact Sheet for Patients: BloggerCourse.com  Fact Sheet for Healthcare Providers: SeriousBroker.it  This test is not yet approved or cleared by the [place] FDA and has been authorized for detection and/or diagnosis of SARS-CoV-2 by FDA under an Emergency Use Authorization (EUA). This EUA will remain in effect (meaning this test can be used) for the duration of the COVID-19 declaration under Section 564(b)(1) of the Act, 21 U.S.C. section 360bbb-3(b)(1), unless the authorization is terminated or revoked.  Performed at [facility], [address] [place], [place] [address]      Time coordinating discharge: Over 30 minutes  SIGNED:   Pennie Banter, DO [facility] 11/11/2020, 3:54 PM   If 7PM-7AM, please contact night-coverage [email or web address]

## 2020-11-11 NOTE — ED Notes (Signed)
Cardiology at bedside.

## 2020-11-11 NOTE — ED Notes (Signed)
Pt returned from nuclear med

## 2020-11-11 NOTE — ED Notes (Signed)
Pt taken to nuclear medicine

## 2020-11-11 NOTE — ED Notes (Signed)
Pt. Resting in bed, breakfast complete. Pt. Denies pain, states his stress test this morning went well, and has no complaints or needs currently. NAD. Will continue to monitor.

## 2020-11-11 NOTE — ED Notes (Signed)
Pt resting in hallway bed, no issues or complaints at this time.

## 2020-11-11 NOTE — ED Notes (Signed)
Pt. Resting in bed, watching his phone. Pt. Is conversational. Denies any pain or need currently. Meal tray to bedside, pt. States "he's good."

## 2020-11-11 NOTE — ED Notes (Signed)
Lab called for d-dimer collect.

## 2020-11-16 LAB — NM MYOCAR MULTI W/SPECT W/WALL MOTION / EF
Estimated workload: 1
Exercise duration (min): 1 min
Exercise duration (sec): 0 s
LV dias vol: 105 mL (ref 62–150)
LV sys vol: 43 mL
MPHR: 173 {beats}/min
Nuc Stress EF: 58 %
Peak HR: 118 {beats}/min
Percent HR: 68 %
Rest HR: 75 {beats}/min
Rest Nuclear Isotope Dose: 10.6 mCi
SDS: 6
SRS: 1
SSS: 7
ST Depression (mm): 0 mm
Stress Nuclear Isotope Dose: 32.1 mCi
TID: 1.04

## 2021-11-17 DIAGNOSIS — N200 Calculus of kidney: Secondary | ICD-10-CM | POA: Insufficient documentation

## 2022-11-07 ENCOUNTER — Encounter: Payer: Self-pay | Admitting: Family Medicine

## 2022-11-07 ENCOUNTER — Ambulatory Visit: Payer: BC Managed Care – PPO | Admitting: Family Medicine

## 2022-11-07 VITALS — BP 118/70 | HR 105 | Temp 98.2°F | Resp 16 | Ht 66.0 in | Wt 215.0 lb

## 2022-11-07 DIAGNOSIS — R000 Tachycardia, unspecified: Secondary | ICD-10-CM | POA: Diagnosis not present

## 2022-11-07 DIAGNOSIS — Z23 Encounter for immunization: Secondary | ICD-10-CM | POA: Diagnosis not present

## 2022-11-07 DIAGNOSIS — I10 Essential (primary) hypertension: Secondary | ICD-10-CM

## 2022-11-07 DIAGNOSIS — F39 Unspecified mood [affective] disorder: Secondary | ICD-10-CM

## 2022-11-07 DIAGNOSIS — N400 Enlarged prostate without lower urinary tract symptoms: Secondary | ICD-10-CM | POA: Insufficient documentation

## 2022-11-07 DIAGNOSIS — E1169 Type 2 diabetes mellitus with other specified complication: Secondary | ICD-10-CM | POA: Insufficient documentation

## 2022-11-07 DIAGNOSIS — E538 Deficiency of other specified B group vitamins: Secondary | ICD-10-CM

## 2022-11-07 DIAGNOSIS — Z1159 Encounter for screening for other viral diseases: Secondary | ICD-10-CM

## 2022-11-07 DIAGNOSIS — R7309 Other abnormal glucose: Secondary | ICD-10-CM

## 2022-11-07 DIAGNOSIS — E785 Hyperlipidemia, unspecified: Secondary | ICD-10-CM | POA: Diagnosis not present

## 2022-11-07 DIAGNOSIS — Z114 Encounter for screening for human immunodeficiency virus [HIV]: Secondary | ICD-10-CM

## 2022-11-07 DIAGNOSIS — E559 Vitamin D deficiency, unspecified: Secondary | ICD-10-CM

## 2022-11-07 NOTE — Assessment & Plan Note (Signed)
History of depression with ongoing thoughts about death. Denies active plan.  Has access to firearms. Locked. No previous attempt.  No current psychiatric follow-up or medication. -Provide resources for therapy. -Consider starting an antidepressant/anxiolytic medication based on patient's preference. -Declined psychiatry referral today -MDQ screening possible indicating BPD

## 2022-11-07 NOTE — Patient Instructions (Addendum)
It was a pleasure meeting you today. Thank you for allowing me to take part in your health care.  Our goals for today as we discussed include:  Schedule appointment for blood work Fast for 10 hours    This is a list of the screening recommended for you and due dates:  Health Maintenance  Topic Date Due   Hepatitis C Screening  Never done   Flu Shot  08/23/2022   COVID-19 Vaccine (4 - 2023-24 season) 09/23/2022   Colon Cancer Screening  11/07/2023*   DTaP/Tdap/Td vaccine (2 - Td or Tdap) 09/21/2025   HIV Screening  Completed   HPV Vaccine  Aged Out  *Topic was postponed. The date shown is not the original due date.    Follow up with Cardiology at [facility]. Continue current medication     If you have any questions or concerns, please do not hesitate to call the office at [phone].  I look forward to our next visit and until then take care and stay safe.  Regards,   Dana Allan, MD   [facility]

## 2022-11-07 NOTE — Assessment & Plan Note (Signed)
Reports of frequent urination since starting hydrochlorothiazide and weak urinary stream. No current urology follow-up. -Order PSA test. -Consider referral to Urology based on PSA results.

## 2022-11-07 NOTE — Assessment & Plan Note (Addendum)
Elevated blood pressure readings with recent episode of severe palpitations. Currently on Amlodipine 5mg  daily and Hydrochlorothiazide 25mg  daily.  -Continue current medications. -Previously prescribed Imdur, Metoprolol and was followed by Cardiology on past at [facility] -Cardiology follow-up scheduled at the end of the month.

## 2022-11-07 NOTE — Progress Notes (Signed)
SUBJECTIVE:   Chief Complaint  Patient presents with   Establish Care   HPI Presents to clinic to establish care  Discussed the use of AI scribe software for clinical note transcription with the patient, who gave verbal consent to proceed.  History of Present Illness The patient, a 47 year old individual, presents for establishing care following a recent emergency room visit due to severe palpitations. The patient describes waking up with a sensation of their heart "thumping" in their chest, neck, and head area, which was severe enough to cause significant distress. This episode was not an isolated incident, but rather the latest in a series of similar episodes occurring over the past couple of years. The emergency room visit resulted in a diagnosis of high blood pressure and a prescription for antihypertensive medication.  The patient has a history of cardiology consultations dating back to 2017 and 2020, during which they were prescribed metoprolol and Imdur. However, the patient discontinued these medications due to perceived lack of necessity and side effects, including erectile dysfunction. The patient also reports a history of depression, for which they were treated with Thorazine and later  Prozac in their late teens. They currently manage their depression without medication but admit to ongoing suicidal ideation without intent or plan.  The patient has a history of kidney stones, treated non-surgically a year ago. They also report an enlarged prostate diagnosed approximately five years ago, which they manage without medication despite experiencing a weak urinary stream. The patient denies any current use of tobacco, alcohol, or illicit substances, although they admit to occasional smoking in the past.  The patient's family history is significant for high blood pressure in both parents and prediabetes in an uncle. The patient's father was diagnosed with stage 1 prostate cancer at age 52  but passed away due to COVID-19. The patient works at a company called [employer] by [employer] and commutes from their residence to [place] for work.  The patient's current medications, prescribed during their recent emergency room visit, include amlodipine, hydrochlorothiazide, and rosuvastatin. The patient reports frequent urination, enough to cause concern about potential dehydration. They also report occasional episodes of leg swelling. The patient's blood pressure readings at home have been consistently high, with some readings reaching 200/126. The patient expresses concern about these episodes of high blood pressure and palpitations, fearing they may die in their sleep.    PERTINENT PMH / PSH: As above  OBJECTIVE:  BP 118/70   Pulse (!) 105   Temp 98.2 F (36.8 C)   Resp 16   Ht 5\' 6"  (1.676 m)   Wt 215 lb (97.5 kg)   SpO2 96%   BMI 34.70 kg/m    Physical Exam Vitals reviewed.  HENT:     Head: Normocephalic.     Right Ear: Tympanic membrane, ear canal and external ear normal.     Left Ear: Tympanic membrane, ear canal and external ear normal.     Nose: Nose normal.     Mouth/Throat:     Mouth: Mucous membranes are moist.  Eyes:     Conjunctiva/sclera: Conjunctivae normal.     Pupils: Pupils are equal, round, and reactive to light.  Neck:     Thyroid: No thyromegaly or thyroid tenderness.     Vascular: No carotid bruit.  Cardiovascular:     Rate and Rhythm: Normal rate and regular rhythm.     Pulses: Normal pulses.     Heart sounds: Normal heart sounds.  Pulmonary:  Effort: Pulmonary effort is normal.     Breath sounds: Normal breath sounds.  Abdominal:     General: Abdomen is flat. Bowel sounds are normal.     Palpations: Abdomen is soft.  Musculoskeletal:        General: Normal range of motion.     Cervical back: Normal range of motion and neck supple.     Right lower leg: No edema.     Left lower leg: No edema.  Lymphadenopathy:     Cervical:  No cervical adenopathy.  Neurological:     Mental Status: He is alert.  Psychiatric:        Mood and Affect: Mood normal.        Behavior: Behavior normal.        Thought Content: Thought content normal.        Judgment: Judgment normal.        11/07/2022    9:25 AM  Depression screen PHQ 2/9  Decreased Interest 3  Down, Depressed, Hopeless 3  PHQ - 2 Score 6  Altered sleeping 3  Tired, decreased energy 3  Change in appetite 3  Feeling bad or failure about yourself  3  Trouble concentrating 1  Moving slowly or fidgety/restless 1  Suicidal thoughts 1  PHQ-9 Score 21  Difficult doing work/chores Somewhat difficult      11/07/2022    9:26 AM  GAD 7 : Generalized Anxiety Score  Nervous, Anxious, on Edge 1  Control/stop worrying 2  Worry too much - different things 2  Trouble relaxing 3  Restless 1  Easily annoyed or irritable 3  Afraid - awful might happen 3  Total GAD 7 Score 15  Anxiety Difficulty Somewhat difficult    ASSESSMENT/PLAN:  Essential hypertension Assessment & Plan: Elevated blood pressure readings with recent episode of severe palpitations. Currently on Amlodipine 5mg  daily and Hydrochlorothiazide 25mg  daily.  -Continue current medications. -Previously prescribed Imdur, Metoprolol and was followed by Cardiology on past at [facility] -Cardiology follow-up scheduled at the end of the month.     Orders: -     Comprehensive metabolic panel; Future  Need for influenza vaccination -     Flu vaccine trivalent PF, 6mos and older(Flulaval,Afluria,Fluarix,Fluzone)  Hyperlipidemia, unspecified hyperlipidemia type Assessment & Plan: On Rosuvastatin 10mg  daily. -Continue current medication. -Order fasting lipid panel to assess control.  Orders: -     Lipid panel; Future  Tachycardia Assessment & Plan: Heart rate consistently above 100 bpm. History of cardiomyopathy and previous use of Metoprolol. -Consider restarting Metoprolol or an alternative  beta-blocker after reviewing lab results. -Check thyroid function tests to rule out hyperthyroidism as a cause of tachycardia. -Cardiology follow-up scheduled at the end of the month.    Orders: -     CBC with Differential/Platelet; Future -     TSH; Future  Abnormal glucose -     Hemoglobin A1c; Future  Need for hepatitis C screening test -     Hepatitis C antibody; Future  Encounter for screening for HIV -     HIV Antibody (routine testing w rflx); Future  Vitamin D deficiency -     VITAMIN D 25 Hydroxy (Vit-D Deficiency, Fractures); Future  Vitamin B 12 deficiency -     Vitamin B12; Future  Benign prostatic hyperplasia, unspecified whether lower urinary tract symptoms present -     PSA; Future  Enlarged prostate without lower urinary tract symptoms (luts) Assessment & Plan: Reports of frequent urination since starting hydrochlorothiazide and  weak urinary stream. No current urology follow-up. -Order PSA test. -Consider referral to Urology based on PSA results.   Mood disorder [facility]) Assessment & Plan: History of depression with ongoing thoughts about death. Denies active plan.  Has access to firearms. Locked. No previous attempt.  No current psychiatric follow-up or medication. -Provide resources for therapy. -Consider starting an antidepressant/anxiolytic medication based on patient's preference. -Declined psychiatry referral today -MDQ screening possible indicating BPD    General Health Maintenance -Declined colon cancer screening. -Check Tetanus vaccine status and update if necessary. -Encourage use of Debrox for ear wax removal.    PDMP reviewed  No follow-ups on file.  Dana Allan, MD

## 2022-11-07 NOTE — Assessment & Plan Note (Signed)
On Rosuvastatin 10mg  daily. -Continue current medication. -Order fasting lipid panel to assess control.

## 2022-11-07 NOTE — Assessment & Plan Note (Addendum)
Heart rate consistently above 100 bpm. History of cardiomyopathy and previous use of Metoprolol. -Consider restarting Metoprolol or an alternative beta-blocker after reviewing lab results. -Check thyroid function tests to rule out hyperthyroidism as a cause of tachycardia. -Cardiology follow-up scheduled at the end of the month.

## 2022-11-13 ENCOUNTER — Other Ambulatory Visit (INDEPENDENT_AMBULATORY_CARE_PROVIDER_SITE_OTHER): Payer: BC Managed Care – PPO

## 2022-11-13 DIAGNOSIS — I10 Essential (primary) hypertension: Secondary | ICD-10-CM

## 2022-11-13 DIAGNOSIS — R000 Tachycardia, unspecified: Secondary | ICD-10-CM | POA: Diagnosis not present

## 2022-11-13 DIAGNOSIS — Z1159 Encounter for screening for other viral diseases: Secondary | ICD-10-CM

## 2022-11-13 DIAGNOSIS — R7309 Other abnormal glucose: Secondary | ICD-10-CM

## 2022-11-13 DIAGNOSIS — N400 Enlarged prostate without lower urinary tract symptoms: Secondary | ICD-10-CM | POA: Diagnosis not present

## 2022-11-13 DIAGNOSIS — E785 Hyperlipidemia, unspecified: Secondary | ICD-10-CM | POA: Diagnosis not present

## 2022-11-13 DIAGNOSIS — E538 Deficiency of other specified B group vitamins: Secondary | ICD-10-CM | POA: Diagnosis not present

## 2022-11-13 DIAGNOSIS — E559 Vitamin D deficiency, unspecified: Secondary | ICD-10-CM

## 2022-11-13 DIAGNOSIS — Z114 Encounter for screening for human immunodeficiency virus [HIV]: Secondary | ICD-10-CM

## 2022-11-13 LAB — COMPREHENSIVE METABOLIC PANEL
ALT: 23 U/L (ref 0–53)
AST: 16 U/L (ref 0–37)
Albumin: 4.2 g/dL (ref 3.5–5.2)
Alkaline Phosphatase: 73 U/L (ref 39–117)
BUN: 20 mg/dL (ref 6–23)
CO2: 25 meq/L (ref 19–32)
Calcium: 8.9 mg/dL (ref 8.4–10.5)
Chloride: 107 meq/L (ref 96–112)
Creatinine, Ser: 1.16 mg/dL (ref 0.40–1.50)
GFR: 73.9 mL/min (ref >60.00)
Glucose, Bld: 130 mg/dL — ABNORMAL HIGH (ref 70–99)
Potassium: 4.1 meq/L (ref 3.5–5.1)
Sodium: 141 meq/L (ref 135–145)
Total Bilirubin: 0.5 mg/dL (ref 0.2–1.2)
Total Protein: 7.2 g/dL (ref 6.0–8.3)

## 2022-11-13 LAB — CBC WITH DIFFERENTIAL/PLATELET
Basophils Absolute: 0.1 10*3/uL (ref 0.0–0.1)
Basophils Relative: 0.9 % (ref 0.0–3.0)
Eosinophils Absolute: 0.3 10*3/uL (ref 0.0–0.7)
Eosinophils Relative: 4.5 % (ref 0.0–5.0)
HCT: 40.9 % (ref 39.0–52.0)
Hemoglobin: 13.3 g/dL (ref 13.0–17.0)
Lymphocytes Relative: 30.6 % (ref 12.0–46.0)
Lymphs Abs: 2 10*3/uL (ref 0.7–4.0)
MCHC: 32.5 g/dL (ref 30.0–36.0)
MCV: 87.3 fL (ref 78.0–100.0)
Monocytes Absolute: 0.7 10*3/uL (ref 0.1–1.0)
Monocytes Relative: 11.3 % (ref 3.0–12.0)
Neutro Abs: 3.5 10*3/uL (ref 1.4–7.7)
Neutrophils Relative %: 52.7 % (ref 43.0–77.0)
Platelets: 177 10*3/uL (ref 150.0–400.0)
RBC: 4.69 Mil/uL (ref 4.22–5.81)
RDW: 13.7 % (ref 11.5–15.5)
WBC: 6.6 10*3/uL (ref 4.0–10.5)

## 2022-11-13 LAB — TSH: TSH: 1.94 u[IU]/mL (ref 0.35–5.50)

## 2022-11-13 LAB — LIPID PANEL
Cholesterol: 144 mg/dL (ref 0–200)
HDL: 29.5 mg/dL — ABNORMAL LOW (ref >39.00)
LDL Cholesterol: 76 mg/dL (ref 0–99)
NonHDL: 114.08
Total CHOL/HDL Ratio: 5
Triglycerides: 190 mg/dL — ABNORMAL HIGH (ref 0.0–149.0)
VLDL: 38 mg/dL (ref 0.0–40.0)

## 2022-11-13 LAB — VITAMIN B12: Vitamin B-12: 193 pg/mL — ABNORMAL LOW (ref 211–911)

## 2022-11-13 LAB — VITAMIN D 25 HYDROXY (VIT D DEFICIENCY, FRACTURES): VITD: 16.02 ng/mL — ABNORMAL LOW (ref 30.00–100.00)

## 2022-11-13 LAB — PSA: PSA: 0.68 ng/mL (ref 0.10–4.00)

## 2022-11-13 LAB — HEMOGLOBIN A1C: Hgb A1c MFr Bld: 7.1 % — ABNORMAL HIGH (ref 4.6–6.5)

## 2022-11-14 LAB — HIV ANTIBODY (ROUTINE TESTING W REFLEX): HIV 1&2 Ab, 4th Generation: NONREACTIVE

## 2022-11-14 LAB — HEPATITIS C ANTIBODY: Hepatitis C Ab: NONREACTIVE

## 2022-11-15 ENCOUNTER — Other Ambulatory Visit: Payer: Self-pay | Admitting: Family Medicine

## 2022-11-15 ENCOUNTER — Encounter: Payer: Self-pay | Admitting: Family Medicine

## 2022-11-15 DIAGNOSIS — E538 Deficiency of other specified B group vitamins: Secondary | ICD-10-CM

## 2022-11-15 DIAGNOSIS — E559 Vitamin D deficiency, unspecified: Secondary | ICD-10-CM

## 2022-11-15 MED ORDER — VITAMIN D (ERGOCALCIFEROL) 1.25 MG (50000 UNIT) PO CAPS: 50000.0000 [IU] | ORAL_CAPSULE | ORAL | 1 refills | Status: DC

## 2022-11-15 MED ORDER — VITAMIN B-12 1000 MCG PO TABS: 1000.0000 ug | ORAL_TABLET | Freq: Every day | ORAL | 3 refills | Status: DC

## 2022-12-01 ENCOUNTER — Other Ambulatory Visit: Payer: Self-pay | Admitting: Family Medicine

## 2022-12-01 DIAGNOSIS — E1169 Type 2 diabetes mellitus with other specified complication: Secondary | ICD-10-CM

## 2022-12-01 MED ORDER — ROSUVASTATIN CALCIUM 10 MG PO TABS: 10.0000 mg | ORAL_TABLET | Freq: Every day | ORAL | 3 refills | Status: DC

## 2022-12-20 ENCOUNTER — Other Ambulatory Visit: Payer: Self-pay | Admitting: Family Medicine

## 2022-12-20 DIAGNOSIS — I152 Hypertension secondary to endocrine disorders: Secondary | ICD-10-CM

## 2022-12-20 MED ORDER — LOSARTAN POTASSIUM-HCTZ 50-12.5 MG PO TABS: 1.0000 | ORAL_TABLET | Freq: Every day | ORAL | 1 refills | Status: DC

## 2022-12-31 ENCOUNTER — Ambulatory Visit: Payer: BC Managed Care – PPO | Admitting: Family Medicine

## 2023-03-11 ENCOUNTER — Encounter: Payer: Self-pay | Admitting: Family Medicine

## 2023-03-11 ENCOUNTER — Ambulatory Visit: Payer: BC Managed Care – PPO | Admitting: Family Medicine

## 2023-03-11 ENCOUNTER — Ambulatory Visit: Payer: BC Managed Care – PPO

## 2023-03-11 VITALS — BP 128/84 | HR 99 | Temp 98.0°F | Resp 18 | Ht 66.0 in | Wt 214.0 lb

## 2023-03-11 DIAGNOSIS — N529 Male erectile dysfunction, unspecified: Secondary | ICD-10-CM | POA: Diagnosis not present

## 2023-03-11 DIAGNOSIS — I10 Essential (primary) hypertension: Secondary | ICD-10-CM | POA: Diagnosis not present

## 2023-03-11 DIAGNOSIS — R5383 Other fatigue: Secondary | ICD-10-CM | POA: Diagnosis not present

## 2023-03-11 DIAGNOSIS — F39 Unspecified mood [affective] disorder: Secondary | ICD-10-CM

## 2023-03-11 DIAGNOSIS — E538 Deficiency of other specified B group vitamins: Secondary | ICD-10-CM

## 2023-03-11 DIAGNOSIS — R3912 Poor urinary stream: Secondary | ICD-10-CM | POA: Diagnosis not present

## 2023-03-11 DIAGNOSIS — T7840XA Allergy, unspecified, initial encounter: Secondary | ICD-10-CM

## 2023-03-11 DIAGNOSIS — E559 Vitamin D deficiency, unspecified: Secondary | ICD-10-CM

## 2023-03-11 DIAGNOSIS — E782 Mixed hyperlipidemia: Secondary | ICD-10-CM

## 2023-03-11 LAB — COMPREHENSIVE METABOLIC PANEL
ALT: 23 U/L (ref 0–53)
AST: 15 U/L (ref 0–37)
Albumin: 4.4 g/dL (ref 3.5–5.2)
Alkaline Phosphatase: 73 U/L (ref 39–117)
BUN: 13 mg/dL (ref 6–23)
CO2: 25 meq/L (ref 19–32)
Calcium: 9.1 mg/dL (ref 8.4–10.5)
Chloride: 110 meq/L (ref 96–112)
Creatinine, Ser: 1.15 mg/dL (ref 0.40–1.50)
GFR: 74.51 mL/min (ref >60.00)
Glucose, Bld: 150 mg/dL — ABNORMAL HIGH (ref 70–99)
Potassium: 3.6 meq/L (ref 3.5–5.1)
Sodium: 144 meq/L (ref 135–145)
Total Bilirubin: 0.4 mg/dL (ref 0.2–1.2)
Total Protein: 7.2 g/dL (ref 6.0–8.3)

## 2023-03-11 LAB — PSA: PSA: 0.71 ng/mL (ref 0.10–4.00)

## 2023-03-11 MED ORDER — FAMOTIDINE 40 MG PO TABS: 40.0000 mg | ORAL_TABLET | Freq: Every day | ORAL | 0 refills | Status: DC

## 2023-03-11 MED ORDER — VITAMIN D (ERGOCALCIFEROL) 1.25 MG (50000 UNIT) PO CAPS: 50000.0000 [IU] | ORAL_CAPSULE | ORAL | 1 refills | Status: DC

## 2023-03-11 MED ORDER — VITAMIN B-12 1000 MCG SL SUBL
2000.0000 ug | SUBLINGUAL_TABLET | Freq: Every day | SUBLINGUAL | 3 refills | Status: AC
Start: 2023-03-11 — End: ?

## 2023-03-11 NOTE — Assessment & Plan Note (Signed)
 PHQ9/GAD elevated.   Consider SSRI in future.  Not interested in medication at this time

## 2023-03-11 NOTE — Assessment & Plan Note (Addendum)
 Has not been taking B12 or Vitamin D supplements Recent TSH normal Checking Testosterone at patients request today

## 2023-03-11 NOTE — Assessment & Plan Note (Signed)
 Patient has stopped taking prescribed statin (Rosuvastatin) due to side effects. Recent lipid panel showed normal LDL -Plan to recheck lipid panel at next visit.

## 2023-03-11 NOTE — Assessment & Plan Note (Signed)
 Decreased libido and inconsistent erectile function. Requesting testosterone levels be checked.   -Check Testosterone level

## 2023-03-11 NOTE — Assessment & Plan Note (Addendum)
 Vitamin B 12 low.  Start Vitamin B 12 2000 mcg s/l daily.

## 2023-03-11 NOTE — Assessment & Plan Note (Signed)
 Patient has stopped taking prescribed antihypertensive medications (Hyzaar, Amlodipine) due to side effects. Blood pressure controlled at the time of visit. -Advise patient to monitor blood pressure at home. -Consider restarting antihypertensive therapy if blood pressure consistently above 150/90.

## 2023-03-11 NOTE — Assessment & Plan Note (Signed)
 Patient experiences allergic reaction to beard dye, presenting as itching and swelling. Currently using leftover Prednisone from previous episode. No edema, erythema on exam today.   -Prescribe Pepcid 40 mg to help manage allergic reactions. -Advise patient to monitor for worsening symptoms and to contact office if reaction becomes severe.

## 2023-03-11 NOTE — Assessment & Plan Note (Signed)
 Patient reports weak urinary stream and occasional feeling of incomplete bladder emptying. -Check PSA

## 2023-03-11 NOTE — Progress Notes (Signed)
 SUBJECTIVE:   Chief Complaint  Patient presents with   testosterone     Wants to get testing done   HPI Presents for acute visit   Discussed the use of AI scribe software for clinical note transcription with the patient, who gave verbal consent to proceed.  History of Present Illness Austin Mcconnell is a 48 year old male who presents with concerns about low testosterone and erectile dysfunction.  He experiences symptoms he attributes to low testosterone, including decreased energy and fluctuations in energy levels. Some days he feels like his 'old self,' while on other days he feels fatigued. He is interested in maintaining his activity levels, both in the gym and sexually, as he approaches his 48th birthday.  He has erectile dysfunction, describing it as having 'a mind of its own,' with inconsistent ability to achieve an erection. Morning erections have been absent for years. Despite these issues, he maintains sexual desire for his girlfriend and is able to ejaculate during sexual relations, although he does not last for extended periods.  He reports a weak urinary stream and sometimes feels that his bladder is not completely emptied. He recalls having a PSA test previously, but the results were not discussed in this conversation.  He has a history of hypertension and hyperlipidemia, for which he was prescribed amlodipine, rosuvastatin, and Hyzaar. He discontinued these medications due to side effects, including tingling in his legs, described as a 'tingly leg going to sleep feeling.' He monitors his blood pressure at home and reports a recent reading of 136/103 mmHg. He uses CBD gummies and THC vapes, which he believes help lower his blood pressure. He experiences swelling in his legs, particularly noticeable when removing his socks, which he attributes to fluid retention. He has been prescribed a diuretic in the past.  He is experiencing an allergic reaction to beard dye, which causes  itching and swelling. He has previously been prescribed steroids for this reaction and is currently using leftover medication from a prior episode. He also takes Benadryl to manage symptoms, which causes drowsiness.      PERTINENT PMH / PSH: As above  OBJECTIVE:  BP 128/84   Pulse 99   Temp 98 F (36.7 C)   Resp 18   Ht 5\' 6"  (1.676 m)   Wt 214 lb (97.1 kg)   SpO2 96%   BMI 34.54 kg/m    Physical Exam Vitals reviewed.  Constitutional:      General: He is not in acute distress.    Appearance: Normal appearance. He is obese. He is not ill-appearing, toxic-appearing or diaphoretic.  Eyes:     General:        Right eye: No discharge.        Left eye: No discharge.  Cardiovascular:     Rate and Rhythm: Normal rate and regular rhythm.     Heart sounds: Normal heart sounds.  Pulmonary:     Effort: Pulmonary effort is normal.     Breath sounds: Normal breath sounds.  Abdominal:     General: Bowel sounds are normal.  Musculoskeletal:        General: Normal range of motion.     Cervical back: Normal range of motion.  Skin:    General: Skin is warm and dry.  Neurological:     Mental Status: He is alert and oriented to person, place, and time. Mental status is at baseline.  Psychiatric:        Mood and Affect: Mood  normal.        Behavior: Behavior normal.        Thought Content: Thought content normal.        Judgment: Judgment normal.           03/11/2023    9:30 AM 11/07/2022    9:25 AM  Depression screen PHQ 2/9  Decreased Interest 3 3  Down, Depressed, Hopeless 2 3  PHQ - 2 Score 5 6  Altered sleeping 1 3  Tired, decreased energy 2 3  Change in appetite 1 3  Feeling bad or failure about yourself  2 3  Trouble concentrating 2 1  Moving slowly or fidgety/restless 0 1  Suicidal thoughts 0 1  PHQ-9 Score 13 21  Difficult doing work/chores Somewhat difficult Somewhat difficult      03/11/2023    9:30 AM 11/07/2022    9:26 AM  GAD 7 : Generalized Anxiety  Score  Nervous, Anxious, on Edge 2 1  Control/stop worrying 2 2  Worry too much - different things 2 2  Trouble relaxing 2 3  Restless 2 1  Easily annoyed or irritable 2 3  Afraid - awful might happen 2 3  Total GAD 7 Score 14 15  Anxiety Difficulty Somewhat difficult Somewhat difficult    ASSESSMENT/PLAN:  Mood disorder (HCC) Assessment & Plan: PHQ9/GAD elevated.   Consider SSRI in future.  Not interested in medication at this time   Erectile dysfunction, unspecified erectile dysfunction type Assessment & Plan: Decreased libido and inconsistent erectile function. Requesting testosterone levels be checked.   -Check Testosterone level  Orders: -     Testosterone,Free and Total  Low energy Assessment & Plan: Has not been taking B12 or Vitamin D supplements Recent TSH normal Checking Testosterone at patients request today    Orders: -     Testosterone,Free and Total -     Comprehensive metabolic panel  Vitamin B 12 deficiency Assessment & Plan: Vitamin B 12 low.  Start Vitamin B 12 2000 mcg s/l daily.   Orders: -     Vitamin B-12; Place 2 tablets (2,000 mcg total) under the tongue daily.  Dispense: 90 tablet; Refill: 3  Vitamin D deficiency Assessment & Plan: Vitamin D low.  Start Vitamin D 1.25 mg weekly for 6 months then switch to over the counter Vitamin D 800iu daily.   Orders: -     Vitamin D (Ergocalciferol); Take 1 capsule (50,000 Units total) by mouth every 7 (seven) days.  Dispense: 12 capsule; Refill: 1  Essential hypertension Assessment & Plan: Patient has stopped taking prescribed antihypertensive medications (Hyzaar, Amlodipine) due to side effects. Blood pressure controlled at the time of visit. -Advise patient to monitor blood pressure at home. -Consider restarting antihypertensive therapy if blood pressure consistently above 150/90.  Orders: -     Comprehensive metabolic panel  Allergic reaction, initial encounter Assessment & Plan: Patient  experiences allergic reaction to beard dye, presenting as itching and swelling. Currently using leftover Prednisone from previous episode. No edema, erythema on exam today.   -Prescribe Pepcid 40 mg to help manage allergic reactions. -Advise patient to monitor for worsening symptoms and to contact office if reaction becomes severe.  Orders: -     Famotidine; Take 1 tablet (40 mg total) by mouth daily.  Dispense: 90 tablet; Refill: 0  Mixed hyperlipidemia Assessment & Plan: Patient has stopped taking prescribed statin (Rosuvastatin) due to side effects. Recent lipid panel showed normal LDL -Plan to recheck lipid panel at  next visit.   Weak urinary stream Assessment & Plan: Patient reports weak urinary stream and occasional feeling of incomplete bladder emptying. -Check PSA    Orders: -     PSA    PDMP reviewed  Return if symptoms worsen or fail to improve, for PCP.  Dana Allan, MD

## 2023-03-11 NOTE — Assessment & Plan Note (Signed)
 Vitamin D low.  Start Vitamin D 1.25 mg weekly for 6 months then switch to over the  counter Vitamin D 800iu daily.

## 2023-03-11 NOTE — Patient Instructions (Addendum)
 It was a pleasure meeting you today. Thank you for allowing me to take part in your health care.  Our goals for today as we discussed include:  Vitamin B 12 low.  Start Vitamin B 12 2000 mcg daily.  Dissolvable tablets or liquid solution.   Vitamin D low.  Start Vitamin D 1.25 mg weekly for 6 months then switch to over the counter Vitamin D 800iu daily.   Monitor blood pressure at home.  Goal <140/90  Take Pepcid 40 mg daily for allergic reaction.  Can increase to two tablets for couple of days.  Do not need to continue for more than 5 days  We will get some labs today.  If they are abnormal or we need to do something about them, I will call you.  If they are normal, I will send you a message on MyChart (if it is active) or a letter in the mail.  If you don't hear from [place] in 2 weeks, please call the office at the number below.   Follow up as needed   This is a list of the screening recommended for you and due dates:  Health Maintenance  Topic Date Due   COVID-19 Vaccine (4 - 2024-25 season) 09/23/2022   Colon Cancer Screening  11/07/2023*   DTaP/Tdap/Td vaccine (2 - Td or Tdap) 09/21/2025   Flu Shot  Completed   Hepatitis C Screening  Completed   HIV Screening  Completed   Pneumococcal Vaccination  Aged Out   HPV Vaccine  Aged Out  *Topic was postponed. The date shown is not the original due date.     If you have any questions or concerns, please do not hesitate to call the office at [phone].  I look forward to our next visit and until then take care and stay safe.  Regards,   Dana Allan, MD   [facility]

## 2023-03-16 LAB — TESTOSTERONE,FREE AND TOTAL
Testosterone, Free: 6.3 pg/mL — ABNORMAL LOW (ref 6.8–21.5)
Testosterone: 354 ng/dL (ref 264–916)

## 2023-03-20 IMAGING — CR DG CHEST 2V
1 series · 2 of 2 positions shown · non-contrast
Comparison: 01/02/2015

CLINICAL DATA: Chest pain

EXAM:
CHEST - 2 VIEW

[Series 1: w chest pa · 0.14mm/px · 2 of 2 slices shown]
[im 1/2]
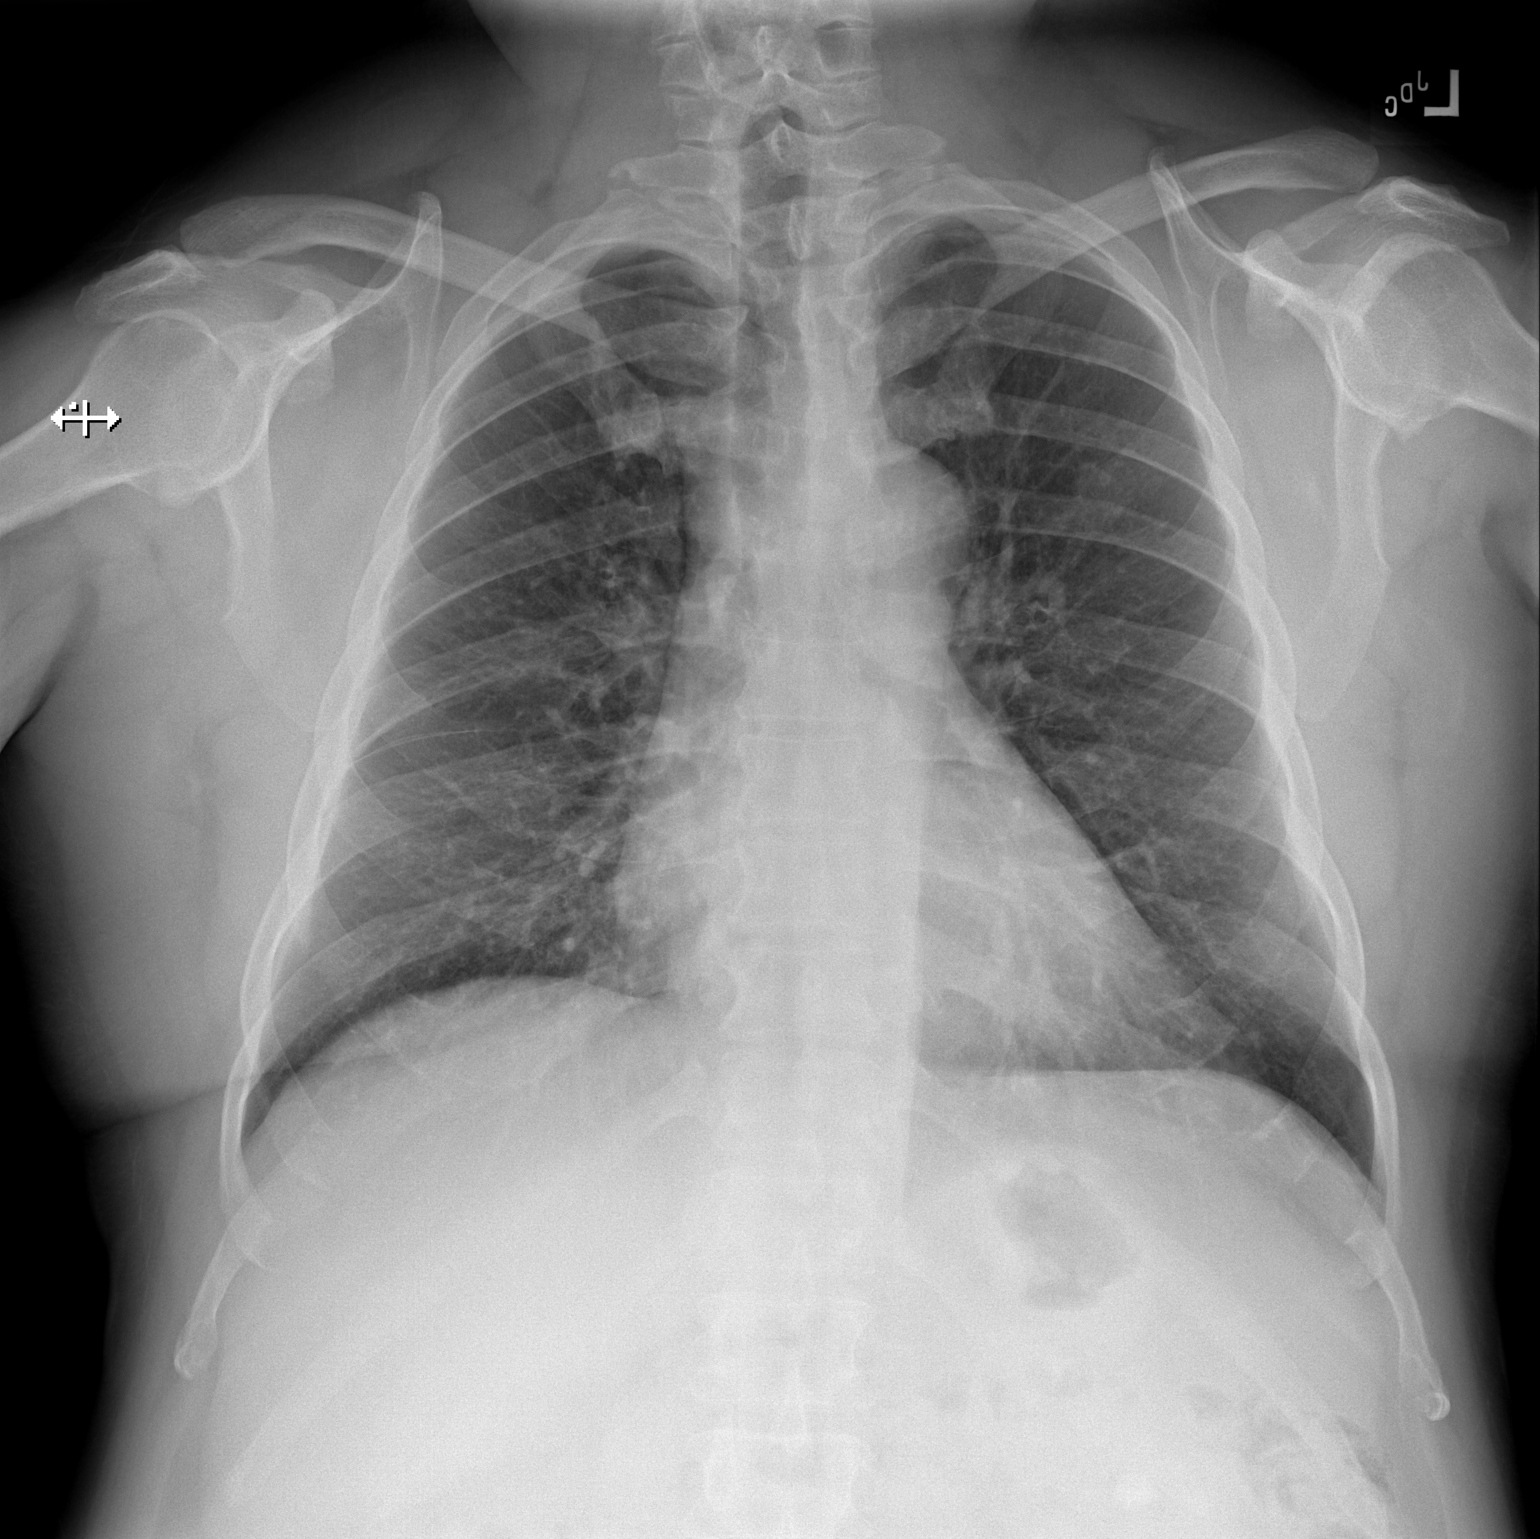
[im 2/2]
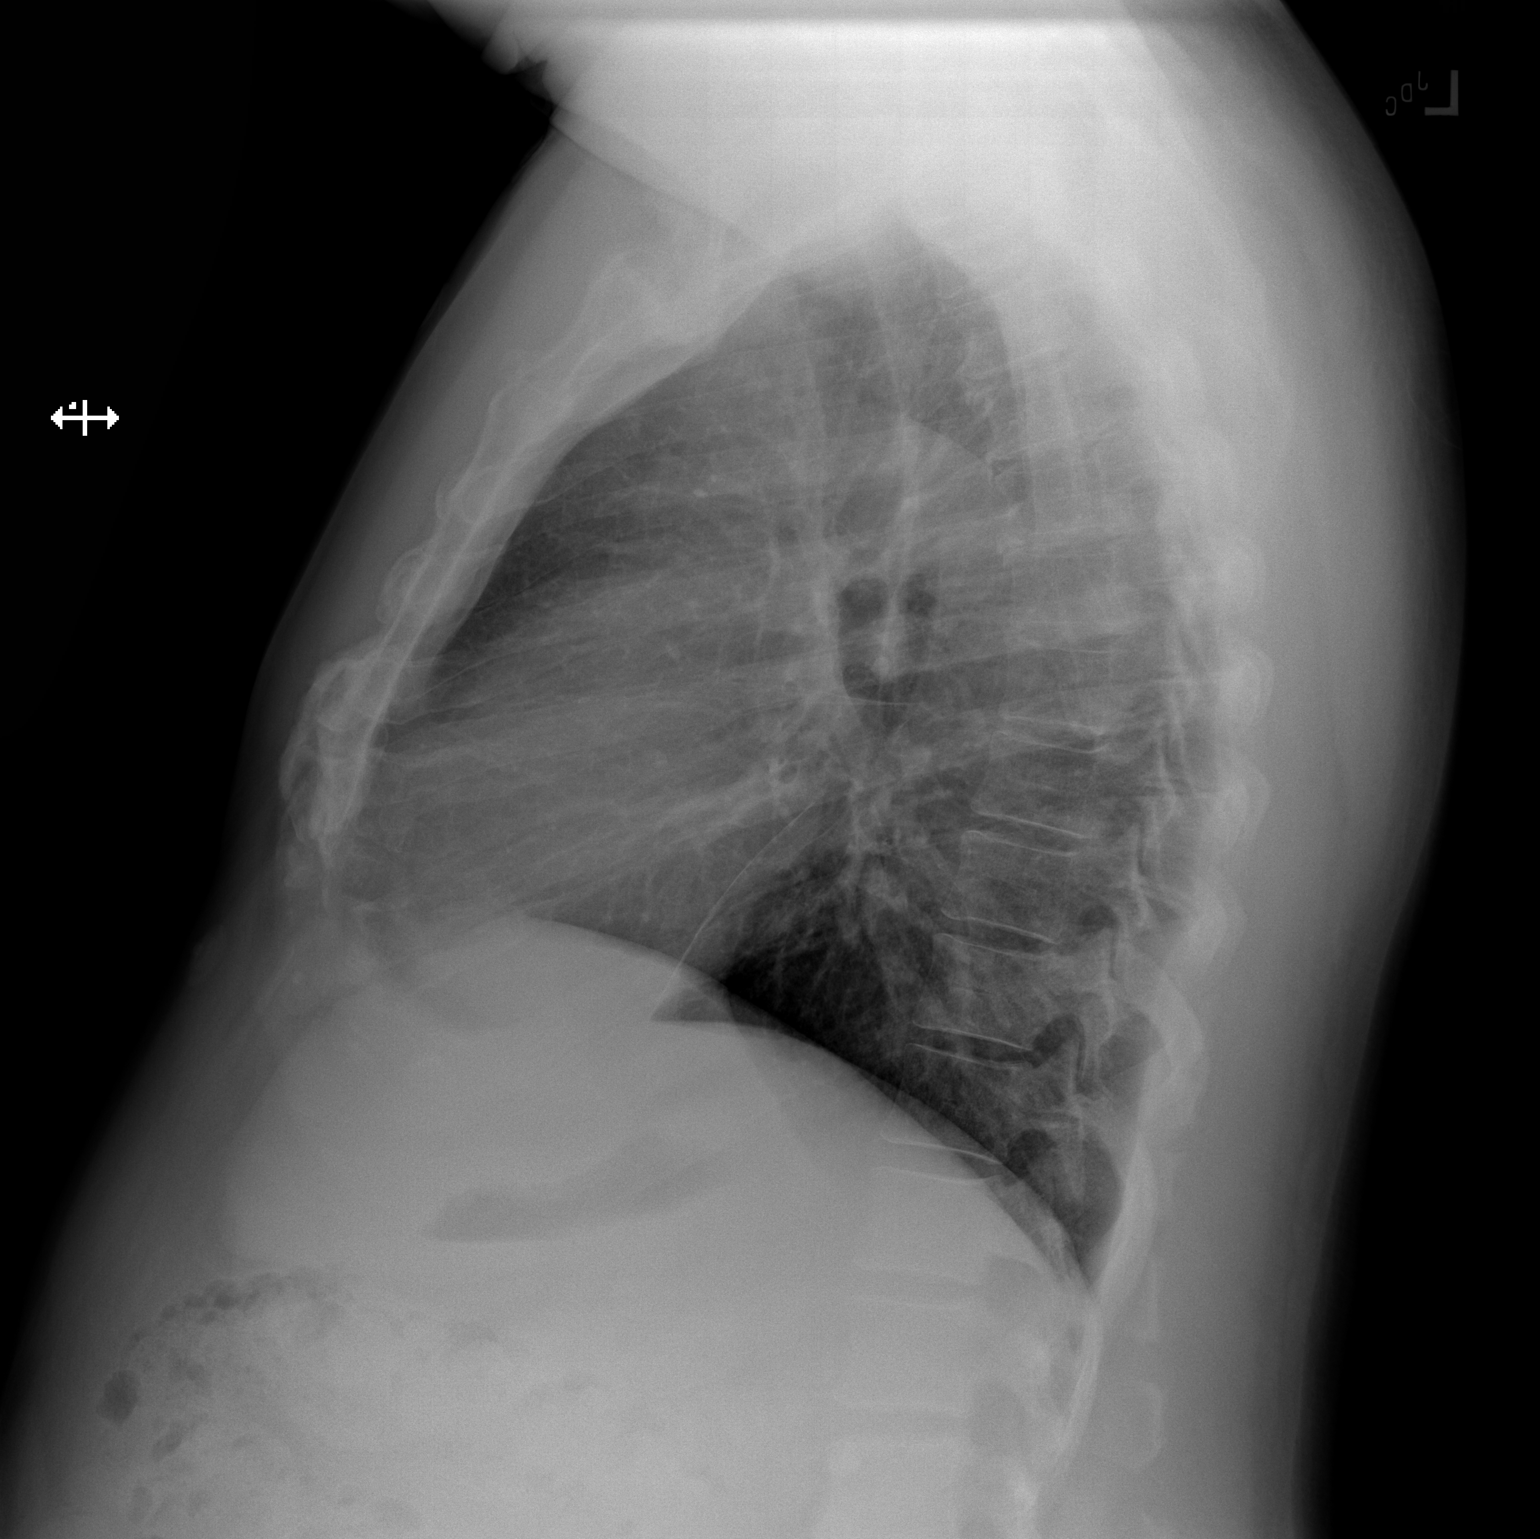

[2 of 2 positions shown; findings below may reference images not displayed]

FINDINGS: The heart size and mediastinal contours are within normal limits.
Both lungs are clear. The visualized skeletal structures are
unremarkable.
IMPRESSION: No active cardiopulmonary disease.

## 2023-04-29 ENCOUNTER — Other Ambulatory Visit: Payer: Self-pay

## 2023-04-29 ENCOUNTER — Telehealth: Payer: Self-pay

## 2023-04-29 NOTE — Telephone Encounter (Signed)
 Left message to return call to our office.  Okay to relay results to pt. Please document when pt is spoke to.

## 2023-04-29 NOTE — Telephone Encounter (Signed)
-----   Message from Dana Allan sent at 04/28/2023  6:20 PM EDT ----- Testosterone is slightly lower than normal.  Does he want a referral to Urology?  All other blood work acceptable

## 2023-04-29 NOTE — Telephone Encounter (Signed)
 Called message to set up a TOC, patient will call back to schedule.  Patient was also given his lab results from February. Patient would like a referral to Urology. He would like to see Dr Annabell Howells, Urologist in [place]

## 2023-04-30 NOTE — Telephone Encounter (Signed)
Referral has been placed for pt.

## 2023-04-30 NOTE — Addendum Note (Signed)
 Addended by: Vertis Kelch on: 04/30/2023 08:35 AM   Modules accepted: Orders

## 2023-06-10 ENCOUNTER — Telehealth: Payer: Self-pay

## 2023-06-10 NOTE — Telephone Encounter (Signed)
 Copied from CRM [id number]. Topic: Appointments - Transfer of Care >> Jun 10, 2023 10:57 AM Austin Mcconnell wrote: Pt is requesting to transfer FROM: Austin Mcconnell Pt is requesting to transfer TO: anyone at [facility] accepting new patients Reason for requested transfer: patient stated he heard Dr. Sueanne Mcconnell was leaving and he would like recommendation from her as to who she thinks would work best for him that is accepting new patients at this time Austin Mcconnell, Austin Mcconnell, Austin Mcconnell) It is the responsibility of the team the patient would like to transfer to (other MD at [facility]) to reach out to the patient if for any reason this transfer is not acceptable.

## 2023-06-10 NOTE — Telephone Encounter (Signed)
 Noted

## 2023-06-15 ENCOUNTER — Other Ambulatory Visit: Payer: Self-pay | Admitting: Family Medicine

## 2023-06-15 DIAGNOSIS — T7840XA Allergy, unspecified, initial encounter: Secondary | ICD-10-CM

## 2023-06-19 ENCOUNTER — Telehealth: Payer: Self-pay | Admitting: Family Medicine

## 2023-06-19 ENCOUNTER — Telehealth: Payer: Self-pay

## 2023-06-19 NOTE — Telephone Encounter (Unsigned)
 Copied from CRM [id number]. Topic: Clinical - Medication Refill >> Jun 19, 2023  1:15 PM Chuck Crater wrote: Medication: amLODipine  (NORVASC ) 5 MG tablet  sildenafil 100 mg  Has the patient contacted their pharmacy? Yes (Agent: If no, request that the patient contact the pharmacy for the refill. If patient does not wish to contact the pharmacy document the reason why and proceed with request.) (Agent: If yes, when and what did the pharmacy advise?) no longer see provider that prescribe sidenafil  This is the patient's preferred pharmacy:  [id number] [place], [place] - [address] AT [facility] OF [address] [place] [address] Phone: [phone] Fax: [phone]  Is this the correct pharmacy for this prescription? Yes If no, delete pharmacy and type the correct one.   Has the prescription been filled recently? No  Is the patient out of the medication? Yes  Has the patient been seen for an appointment in the last year OR does the patient have an upcoming appointment? Yes  Can we respond through MyChart? No  Agent: Please be advised that Rx refills may take up to 3 business days. We ask that you follow-up with your pharmacy.

## 2023-06-19 NOTE — Telephone Encounter (Signed)
 Please see below. PCP to advise.  Copied from CRM [id number]. Topic: Clinical - Prescription Issue >> Jun 19, 2023  1:21 PM Austin Mcconnell wrote: Reason for CRM: Patient wants to make sure that he will continue to get refills on  amLODipine  (NORVASC ) 5 MG tablet and sildenafil  100 mg until he finds a new provider when Dr. Sueanne Emerald leaves the practice.

## 2023-06-20 NOTE — Telephone Encounter (Signed)
 He started back taking Amlodipine  again because his bp was  138/105 131/94 144/105 but it went down to 123/93 125/88 after he started back the medication  and today 145/107.

## 2023-06-20 NOTE — Telephone Encounter (Signed)
 Pt has an appointment 06/21/2023 to talk about medications and his BP

## 2023-06-21 ENCOUNTER — Ambulatory Visit: Payer: Self-pay | Admitting: Family Medicine

## 2023-06-21 ENCOUNTER — Ambulatory Visit: Admitting: Family Medicine

## 2023-06-21 ENCOUNTER — Encounter: Payer: Self-pay | Admitting: Family Medicine

## 2023-06-21 VITALS — BP 118/67 | HR 85 | Temp 98.2°F | Resp 20 | Ht 66.0 in | Wt 211.5 lb

## 2023-06-21 DIAGNOSIS — R7989 Other specified abnormal findings of blood chemistry: Secondary | ICD-10-CM

## 2023-06-21 DIAGNOSIS — I152 Hypertension secondary to endocrine disorders: Secondary | ICD-10-CM

## 2023-06-21 DIAGNOSIS — E118 Type 2 diabetes mellitus with unspecified complications: Secondary | ICD-10-CM

## 2023-06-21 DIAGNOSIS — E1159 Type 2 diabetes mellitus with other circulatory complications: Secondary | ICD-10-CM

## 2023-06-21 DIAGNOSIS — F39 Unspecified mood [affective] disorder: Secondary | ICD-10-CM

## 2023-06-21 DIAGNOSIS — Z7984 Long term (current) use of oral hypoglycemic drugs: Secondary | ICD-10-CM

## 2023-06-21 DIAGNOSIS — E538 Deficiency of other specified B group vitamins: Secondary | ICD-10-CM

## 2023-06-21 DIAGNOSIS — E1169 Type 2 diabetes mellitus with other specified complication: Secondary | ICD-10-CM

## 2023-06-21 DIAGNOSIS — E785 Hyperlipidemia, unspecified: Secondary | ICD-10-CM

## 2023-06-21 DIAGNOSIS — E559 Vitamin D deficiency, unspecified: Secondary | ICD-10-CM

## 2023-06-21 DIAGNOSIS — N529 Male erectile dysfunction, unspecified: Secondary | ICD-10-CM | POA: Diagnosis not present

## 2023-06-21 LAB — POCT GLYCOSYLATED HEMOGLOBIN (HGB A1C): Hemoglobin A1C: 6.7 % — AB (ref 4.0–5.6)

## 2023-06-21 MED ORDER — SILDENAFIL CITRATE 100 MG PO TABS: 100.0000 mg | ORAL_TABLET | Freq: Every day | ORAL | 1 refills | Status: AC | PRN

## 2023-06-21 MED ORDER — LOSARTAN POTASSIUM 25 MG PO TABS: 25.0000 mg | ORAL_TABLET | Freq: Every day | ORAL | 3 refills | Status: AC

## 2023-06-21 MED ORDER — METFORMIN HCL ER 500 MG PO TB24: 500.0000 mg | ORAL_TABLET | Freq: Every day | ORAL | 3 refills | Status: AC

## 2023-06-21 MED ORDER — ROSUVASTATIN CALCIUM 10 MG PO TABS: 10.0000 mg | ORAL_TABLET | Freq: Every day | ORAL | 3 refills | Status: AC

## 2023-06-21 NOTE — Patient Instructions (Addendum)
 It was a pleasure meeting you today. Thank you for allowing me to take part in your health care.  Our goals for today as we discussed include:  Refill sent for Sildenafil   Stop Amlodipine  Start Losartan  25 mg daily Start Crestor  10 mg daily  A1c 6.7 today.  Slightly decreased. Start Metformin  XR 500 mg daily   Schedule lab appointment in 2 weeks. Schedule nurse appointment in 2 weeks for BP check. Please bring in blood pressure monitor to visit  Follow up in 3 months  This is a list of the screening recommended for you and due dates:  Health Maintenance  Topic Date Due   COVID-19 Vaccine (4 - 2024-25 season) 09/23/2022   Zoster (Shingles) Vaccine (1 of 2) Never done   Colon Cancer Screening  11/07/2023*   Flu Shot  08/23/2023   DTaP/Tdap/Td vaccine (2 - Td or Tdap) 09/21/2025   Hepatitis C Screening  Completed   HIV Screening  Completed   Pneumococcal Vaccination  Aged Out   HPV Vaccine  Aged Out   Meningitis B Vaccine  Aged Out  *Topic was postponed. The date shown is not the original due date.      If you have any questions or concerns, please do not hesitate to call the office at [phone].  I look forward to our next visit and until then take care and stay safe.  Regards,   Valli Gaw, MD   [facility]

## 2023-06-21 NOTE — Progress Notes (Signed)
 SUBJECTIVE:   Chief Complaint  Patient presents with   Hypertension   Erectile Dysfunction   HPI Presents for follow up chronic disease management  Discussed the use of AI scribe software for clinical note transcription with the patient, who gave verbal consent to proceed.  History of Present Illness Austin Mcconnell is a 48 year old male with hypertension who presents with concerns about medication adherence and erectile dysfunction.  He stopped his blood pressure medication in February due to concerns about erectile dysfunction. He has since restarted amlodipine  but forgets to take it two to three times a week. His home blood pressure cuff indicates high readings, and he experiences headaches when his blood pressure is elevated.  He has a history of low testosterone , which was checked during his last visit. He has not pursued a referral to urology for further management of his low testosterone  and erectile dysfunction. He uses Viagra  as needed for erectile dysfunction, breaking 100 mg tablets in half to extend his use. His erectile dysfunction is intermittent.  He recalls having a vasectomy performed in March of this year. He also has a history of a swollen prostate and kidney stones. He was previously prescribed sildenafil  for these issues.  He has tried a Psychologist, sport and exercise brand of Cialis but has not used the prescribed version. The cost of sildenafil  with insurance is approximately thirty dollars.     PERTINENT PMH / PSH: As above  OBJECTIVE:  BP 118/67   Pulse 85   Temp 98.2 F (36.8 C)   Resp 20   Ht 5\' 6"  (1.676 m)   Wt 211 lb 8 oz (95.9 kg)   SpO2 98%   BMI 34.14 kg/m    Physical Exam Vitals reviewed.  Constitutional:      General: He is not in acute distress.    Appearance: Normal appearance. He is obese. He is not ill-appearing, toxic-appearing or diaphoretic.  Eyes:     General:        Right eye: No discharge.        Left eye: No discharge.  Cardiovascular:      Rate and Rhythm: Normal rate and regular rhythm.     Heart sounds: Normal heart sounds.  Pulmonary:     Effort: Pulmonary effort is normal.     Breath sounds: Normal breath sounds.  Abdominal:     General: Bowel sounds are normal.  Musculoskeletal:        General: Normal range of motion.     Cervical back: Normal range of motion.  Skin:    General: Skin is warm and dry.  Neurological:     Mental Status: He is alert and oriented to person, place, and time. Mental status is at baseline.  Psychiatric:        Mood and Affect: Mood normal.        Behavior: Behavior normal.        Thought Content: Thought content normal.        Judgment: Judgment normal.           06/21/2023   10:40 AM 03/11/2023    9:30 AM 11/07/2022    9:25 AM  Depression screen PHQ 2/9  Decreased Interest 2 3 3   Down, Depressed, Hopeless 2 2 3   PHQ - 2 Score 4 5 6   Altered sleeping 2 1 3   Tired, decreased energy 3 2 3   Change in appetite 2 1 3   Feeling bad or failure about yourself  2 2  3  Trouble concentrating 1 2 1   Moving slowly or fidgety/restless 0 0 1  Suicidal thoughts 0 0 1  PHQ-9 Score 14 13 21   Difficult doing work/chores Not difficult at all Somewhat difficult Somewhat difficult      06/21/2023   10:40 AM 03/11/2023    9:30 AM 11/07/2022    9:26 AM  GAD 7 : Generalized Anxiety Score  Nervous, Anxious, on Edge 1 2 1   Control/stop worrying 1 2 2   Worry too much - different things 1 2 2   Trouble relaxing 0 2 3  Restless 0 2 1  Easily annoyed or irritable 1 2 3   Afraid - awful might happen 1 2 3   Total GAD 7 Score 5 14 15   Anxiety Difficulty Not difficult at all Somewhat difficult Somewhat difficult    ASSESSMENT/PLAN:  Hypertension associated with diabetes (HCC) Assessment & Plan: Inconsistent management due to irregular medication adherence. Home readings high, office reading normal. Uncertainty about home cuff accuracy.  Had prescription for Norvasc  from 10/2022 and restarted but has  been inconsistent. - Discontinue Amlodipine  - Start Losartan  25 mg daily given DMT2 - Repeat Bmet in 2 weeks to monitor kidney function - Instruct him to bring home blood pressure cuff for calibration.  Orders: -     Losartan  Potassium; Take 1 tablet (25 mg total) by mouth daily.  Dispense: 90 tablet; Refill: 3 -     Basic metabolic panel with GFR; Future  Hyperlipidemia associated with type 2 diabetes mellitus (HCC) Assessment & Plan: LDL not at goal <70 -Start Crestor  10 mg daily  Orders: -     Rosuvastatin  Calcium ; Take 1 tablet (10 mg total) by mouth daily.  Dispense: 90 tablet; Refill: 3  Type 2 diabetes with complication (HCC) Assessment & Plan: Recent A1c 6.7.  Has improved without medication -Start Metformin  XL 500 mg daily -ARB for HTN -Start statin -Requires annual foot and eye exam -Follow up in 3 months  Orders: -     POCT glycosylated hemoglobin (Hb A1C) -     metFORMIN  HCl ER; Take 1 tablet (500 mg total) by mouth daily with breakfast.  Dispense: 90 tablet; Refill: 3 -     Microalbumin / creatinine urine ratio; Future  Erectile dysfunction, unspecified erectile dysfunction type Assessment & Plan: Patient thought due to Amlodipine  and discontinued use. Intermittent sildenafil  use reported. Recent testosterone  low. - Refer to Dr. Inga Manges at [facility] for evaluation - Refill sildenafil  50 mg as needed   Orders: -     Sildenafil  Citrate; Take 1 tablet (100 mg total) by mouth daily as needed for erectile dysfunction.  Dispense: 10 tablet; Refill: 1 -     Ambulatory referral to Urology  Vitamin D  deficiency Assessment & Plan: Vitamin D  low.  Check Vitamin D  level  Orders: -     VITAMIN D  25 Hydroxy (Vit-D Deficiency, Fractures); Future  Vitamin B 12 deficiency Assessment & Plan: Vitamin B 12 low.    Orders: -     Vitamin B12; Future  Mood disorder (HCC) Assessment & Plan: PHQ9 elevated.  Denies SI/Hi Not interested in medication at this  time   Low testosterone  -     Ambulatory referral to Urology   PDMP reviewed  Return in about 3 months (around 09/21/2023) for PCP.  Valli Gaw, MD

## 2023-06-25 ENCOUNTER — Encounter: Payer: Self-pay | Admitting: Family Medicine

## 2023-06-25 DIAGNOSIS — R7989 Other specified abnormal findings of blood chemistry: Secondary | ICD-10-CM | POA: Insufficient documentation

## 2023-06-25 DIAGNOSIS — E118 Type 2 diabetes mellitus with unspecified complications: Secondary | ICD-10-CM | POA: Insufficient documentation

## 2023-06-25 NOTE — Assessment & Plan Note (Signed)
 PHQ9 elevated.  Denies SI/Hi Not interested in medication at this time

## 2023-06-25 NOTE — Assessment & Plan Note (Addendum)
 Patient thought due to Amlodipine  and discontinued use. Intermittent sildenafil  use reported. Recent testosterone  low. - Refer to Dr. Inga Manges at [facility] for evaluation - Refill sildenafil  50 mg as needed

## 2023-06-25 NOTE — Assessment & Plan Note (Signed)
 Vitamin B 12 low.

## 2023-06-25 NOTE — Assessment & Plan Note (Signed)
 Inconsistent management due to irregular medication adherence. Home readings high, office reading normal. Uncertainty about home cuff accuracy.  Had prescription for Norvasc  from 10/2022 and restarted but has been inconsistent. - Discontinue Amlodipine  - Start Losartan  25 mg daily given DMT2 - Repeat Bmet in 2 weeks to monitor kidney function - Instruct him to bring home blood pressure cuff for calibration.

## 2023-06-25 NOTE — Assessment & Plan Note (Signed)
 Recent A1c 6.7.  Has improved without medication -Start Metformin  XL 500 mg daily -ARB for HTN -Start statin -Requires annual foot and eye exam -Follow up in 3 months

## 2023-06-25 NOTE — Assessment & Plan Note (Signed)
 Vitamin D  low.  Check Vitamin D  level

## 2023-06-25 NOTE — Assessment & Plan Note (Signed)
 LDL not at goal <70 -Start Crestor  10 mg daily

## 2023-07-08 ENCOUNTER — Ambulatory Visit (INDEPENDENT_AMBULATORY_CARE_PROVIDER_SITE_OTHER)

## 2023-07-08 ENCOUNTER — Other Ambulatory Visit: Payer: Self-pay

## 2023-07-08 ENCOUNTER — Ambulatory Visit

## 2023-07-08 VITALS — BP 134/90 | HR 86 | Temp 98.7°F | Resp 20 | Ht 66.0 in | Wt 201.1 lb

## 2023-07-08 DIAGNOSIS — E1159 Type 2 diabetes mellitus with other circulatory complications: Secondary | ICD-10-CM | POA: Diagnosis not present

## 2023-07-08 DIAGNOSIS — I152 Hypertension secondary to endocrine disorders: Secondary | ICD-10-CM

## 2023-07-08 DIAGNOSIS — R051 Acute cough: Secondary | ICD-10-CM | POA: Insufficient documentation

## 2023-07-08 DIAGNOSIS — R0982 Postnasal drip: Secondary | ICD-10-CM | POA: Insufficient documentation

## 2023-07-08 MED ORDER — BENZONATATE 200 MG PO CAPS
200.0000 mg | ORAL_CAPSULE | Freq: Three times a day (TID) | ORAL | 0 refills | Status: AC | PRN
Start: 2023-07-08 — End: ?

## 2023-07-08 MED ORDER — FLUTICASONE PROPIONATE 50 MCG/ACT NA SUSP: 2.0000 | Freq: Every day | NASAL | 0 refills | Status: AC

## 2023-07-08 MED ORDER — FEXOFENADINE HCL 180 MG PO TABS: 180.0000 mg | ORAL_TABLET | Freq: Every day | ORAL | 0 refills | Status: AC

## 2023-07-08 NOTE — Assessment & Plan Note (Signed)
 Plan per cough.

## 2023-07-08 NOTE — Addendum Note (Signed)
 Addended by: Gradie Lawless A on: 07/08/2023 12:05 PM   Modules accepted: Orders

## 2023-07-08 NOTE — Assessment & Plan Note (Addendum)
 Blood pressure above goal today. Likely multifactorial (ongoing illness, not taking Losartan  as recommended by Dr. Sueanne Emerald during last OV). Patient counseled on restarting Losartan  25 mg daily. Continue home BP check and bring home BP for review during next OV.

## 2023-07-08 NOTE — Progress Notes (Signed)
 Acute Office Visit  Subjective:    Patient ID: Austin Mcconnell, male    DOB: [birth year], 48 y.o.   MRN: [id number]  Chief Complaint  Patient presents with   URI    X 2 days cough and head congestion   Patient is in today for evaluation of URI like symptoms (nasal congestion, dry cough) for 2 days.  Symptom onset: 07/06/23, Cough, sore throat, sneezing, nasal congestion.  The cough is described as dry and occurs constantly, endorses laying down makes cough worse, waking him up from sleep. He denies fever, shortness of breath, body aches, nausea, vomiting, diarrhea. He endorses wheezing for 1 day. The patient has not tried over-the-counter medications. Denies h/o asthma, exposure to sick contacts. Also reports of no h/o smoking ( medical records states patient has a h/o smoking). Has OSA on CPAP.  Has seasonal allergy and takes Claritin, Zyrtec, benadryl prn when he has symptoms (mostly around April).   He has not been taking Losartan  for hypertension. Due for pneumonia immunization.    As per HPI    Objective:    BP (!) 134/90   Pulse 86   Temp 98.7 F (37.1 C)   Resp 20   Ht 5' 6 (1.676 m)   Wt 201 lb 2 oz (91.2 kg)   SpO2 98%   BMI 32.46 kg/m    Physical Exam HENT:     Head: Normocephalic and atraumatic.     Right Ear: Tympanic membrane normal.     Left Ear: Tympanic membrane normal. There is impacted cerumen (partially impacter cerumen).     Mouth/Throat:     Mouth: Mucous membranes are moist.     Pharynx: Posterior oropharyngeal erythema present. No oropharyngeal exudate.   Cardiovascular:     Rate and Rhythm: Normal rate.  Pulmonary:     Breath sounds: No wheezing or rales.     Comments: Deep inspiration brings on cough Abdominal:     Tenderness: There is no abdominal tenderness.   Musculoskeletal:     Cervical back: No rigidity.     Right lower leg: No edema.     Left lower leg: No edema.  Lymphadenopathy:     Cervical: No cervical adenopathy.   Skin:     General: Skin is warm.   Neurological:     Mental Status: He is alert and oriented to person, place, and time.   Psychiatric:        Mood and Affect: Mood normal.    No results found for any visits on 07/08/23.     Assessment & Plan:  Acute cough Assessment & Plan: Likely viral URI with cough.  Symptomatic management with hydration, rest, humidifier in bedroom, 1 tsp honey twice a day prn, saltwater gargle,  Lozenges recommended.   Nasal saline rinses: You can try nasal saline rinses 2-3 times a day to clear mucous and allergens. EgNeilMed Sinus Rinse.   Use nasal Flonase 2 puffs in each nostril daily for next 10 days. Take Allegra 1 tab daily for the next 7 days.  Benzonatate 200 Mg, take one tablet three times  a day as needed for cough.  Recommended against use of nasal decongestant given h/o hypertension.   Counseled patient on nature of viral illness. Recommend reevaluation if symptoms persists for next 7-10 days or sooner if worsens. Consider chest x-ray.  Work note provided from 6/16-6/18.    Orders: -     Benzonatate; Take 1 capsule (200 mg total) by mouth 3 (  three) times daily as needed for cough.  Dispense: 21 capsule; Refill: 0 -     Fluticasone Propionate; Place 2 sprays into both nostrils daily.  Dispense: 16 g; Refill: 0  Postnasal drip Assessment & Plan: Plan per cough.   Orders: -     Fluticasone Propionate; Place 2 sprays into both nostrils daily.  Dispense: 16 g; Refill: 0 -     Fexofenadine HCl; Take 1 tablet (180 mg total) by mouth daily.  Dispense: 30 tablet; Refill: 0  Hypertension associated with diabetes (HCC) Assessment & Plan: Blood pressure above goal today. Likely multifactorial (ongoing illness, not taking Losartan  as recommended by Dr. Sueanne Emerald during last OV). Patient counseled on restarting Losartan  25 mg daily. Continue home BP check and bring home BP for review during next OV.     I spent 35 minutes on the day of this face-to-face  encounter reviewing the patient's medical history, current medications, ongoing cough, and reviewing the assessment and plan with the patient. This time also included counseling the patient on their health conditions and management options.    Return if symptoms worsen or fail to improve.  Jacklin Mascot, MD

## 2023-07-08 NOTE — Assessment & Plan Note (Addendum)
 Likely viral URI with cough.  Symptomatic management with hydration, rest, humidifier in bedroom, 1 tsp honey twice a day prn, saltwater gargle,  Lozenges recommended.   Nasal saline rinses: You can try nasal saline rinses 2-3 times a day to clear mucous and allergens. EgNeilMed Sinus Rinse.   Use nasal Flonase 2 puffs in each nostril daily for next 10 days. Take Allegra 1 tab daily for the next 7 days.  Benzonatate 200 Mg, take one tablet three times  a day as needed for cough.  Recommended against use of nasal decongestant given h/o hypertension.   Counseled patient on nature of viral illness. Recommend reevaluation if symptoms persists for next 7-10 days or sooner if worsens. Consider chest x-ray.  Work note provided from 6/16-6/18.

## 2023-07-08 NOTE — Patient Instructions (Addendum)
 Stay Hydrated: Drink plenty of fluids such as water, and broths. Staying hydrated helps thin mucus and soothe your throat. Rest: Ensure you get plenty of rest to help your body fight off the virus. Humidify the Air: Use a humidifier in your room to keep the air moist, which can ease coughing and congestion. Honey: A spoonful of honey can help soothe a sore throat and reduce coughing.  Saltwater Gargle: Gargling with warm salt water can help relieve throat irritation.  Cough Drops or Lozenges: These can help soothe your throat.  Nasal saline rinses: You can try nasal saline rinses 2-3 times a day to clear mucous and allergens. EgNeilMed Sinus Rinse.   Use nasal Flonase 2 puffs in each nostril daily for next 10 days. Take Allegra 1 tab daily for the next 7 days. Both of these can help in reducing nasal discharge.   Benzonatate 200 Mg, take one tablet three times  a day as needed for cough.

## 2023-07-09 LAB — VITAMIN B12: Vitamin B-12: 328 pg/mL (ref 232–1245)

## 2023-07-09 LAB — BASIC METABOLIC PANEL WITH GFR
BUN/Creatinine Ratio: 9 (ref 9–20)
BUN: 10 mg/dL (ref 6–24)
CO2: 21 mmol/L (ref 20–29)
Calcium: 9.6 mg/dL (ref 8.7–10.2)
Chloride: 105 mmol/L (ref 96–106)
Creatinine, Ser: 1.06 mg/dL (ref 0.76–1.27)
Glucose: 123 mg/dL — ABNORMAL HIGH (ref 70–99)
Potassium: 4.1 mmol/L (ref 3.5–5.2)
Sodium: 142 mmol/L (ref 134–144)
eGFR: 85 mL/min/{1.73_m2} (ref >59)

## 2023-07-09 LAB — MICROALBUMIN / CREATININE URINE RATIO
Creatinine, Urine: 171.1 mg/dL
Microalb/Creat Ratio: 8 mg/g{creat} (ref 0–29)
Microalbumin, Urine: 13.7 ug/mL

## 2023-07-09 LAB — VITAMIN D 25 HYDROXY (VIT D DEFICIENCY, FRACTURES): Vit D, 25-Hydroxy: 43.7 ng/mL (ref 30.0–100.0)

## 2023-09-16 ENCOUNTER — Ambulatory Visit: Admitting: Internal Medicine

## 2023-09-25 ENCOUNTER — Telehealth: Payer: Self-pay

## 2023-09-25 ENCOUNTER — Encounter: Payer: Self-pay | Admitting: Internal Medicine

## 2023-09-25 NOTE — Telephone Encounter (Signed)
 I called patient and received a message stating the number I have dialed is not in service.  Patient does not have MyChart.  I mailed a letter to patient asking him to please call us  to reschedule his 09/30/2023 appointment with Dr. Hans Baptist.  Dr. Narendra will be out of the office that day.

## 2023-09-30 ENCOUNTER — Ambulatory Visit: Admitting: Internal Medicine

## 2023-10-01 ENCOUNTER — Telehealth: Payer: Self-pay

## 2023-10-01 NOTE — Telephone Encounter (Signed)
 Copied from CRM [id number]. Topic: General - Other >> Oct 01, 2023  8:36 AM Hamdi H wrote: Reason for CRM: Patient called back due to a missed call. I relayed the message to him from the missed call. The patient stated that he will be finding a different doctor elsewhere.  Noted

## 2023-10-01 NOTE — Telephone Encounter (Signed)
 Source Austin Mcconnell (Patient)   Subject Austin Mcconnell (Patient)   Topic Appointments - Transfer of Care   Voided CRM from Changed Reason for Contact [phone]  Communication Patient cancelled transfer of care appt with Dr. Abbey for 01/09/24, he does not want to see this provider since he had a bad experience - he wants to transfer care from Dr. Hope to another doctor but wants to know of Dr. Onesimo, Dr. Glendia, or Dr. Marylynn would be open to accepting him as a new patient since he prefers not to see an NP/PA. Please call him at [phone]   Patient was called and message left that Dr Abbey is the only MD at this time taking TOC patient's , but [name], NP and [name] , NP are taking TOC patients.

## 2023-10-07 ENCOUNTER — Ambulatory Visit: Admitting: Internal Medicine

## 2023-11-05 ENCOUNTER — Ambulatory Visit

## 2023-11-05 DIAGNOSIS — Z23 Encounter for immunization: Secondary | ICD-10-CM

## 2023-11-06 DIAGNOSIS — Z23 Encounter for immunization: Secondary | ICD-10-CM | POA: Diagnosis not present

## 2024-01-09 ENCOUNTER — Encounter
